# Patient Record
Sex: Female | Born: 1935 | Race: White | Hispanic: No | Marital: Married | Smoking: Never smoker
Health system: Southern US, Community
[De-identification: ages and names within clinical notes are randomized; demographics above are authoritative.]

## PROBLEM LIST (undated history)

## (undated) DIAGNOSIS — E119 Type 2 diabetes mellitus without complications: Secondary | ICD-10-CM

## (undated) DIAGNOSIS — Z9081 Acquired absence of spleen: Secondary | ICD-10-CM

## (undated) DIAGNOSIS — M4800 Spinal stenosis, site unspecified: Secondary | ICD-10-CM

## (undated) DIAGNOSIS — I10 Essential (primary) hypertension: Secondary | ICD-10-CM

## (undated) DIAGNOSIS — F419 Anxiety disorder, unspecified: Secondary | ICD-10-CM

## (undated) DIAGNOSIS — S0093XA Contusion of unspecified part of head, initial encounter: Secondary | ICD-10-CM

## (undated) DIAGNOSIS — E039 Hypothyroidism, unspecified: Secondary | ICD-10-CM

## (undated) DIAGNOSIS — S72001A Fracture of unspecified part of neck of right femur, initial encounter for closed fracture: Secondary | ICD-10-CM

## (undated) DIAGNOSIS — M1990 Unspecified osteoarthritis, unspecified site: Secondary | ICD-10-CM

## (undated) DIAGNOSIS — C801 Malignant (primary) neoplasm, unspecified: Secondary | ICD-10-CM

## (undated) HISTORY — PX: BACK SURGERY: SHX140

## (undated) HISTORY — PX: FOOT SURGERY: SHX648

## (undated) HISTORY — PX: APPENDECTOMY: SHX54

## (undated) HISTORY — PX: ABDOMINAL HYSTERECTOMY: SHX81

## (undated) HISTORY — PX: SPLENECTOMY, TOTAL: SHX788

## (undated) HISTORY — PX: HAND SURGERY: SHX662

---

## 2000-05-24 DIAGNOSIS — Z9081 Acquired absence of spleen: Secondary | ICD-10-CM

## 2000-05-24 HISTORY — DX: Acquired absence of spleen: Z90.81

## 2014-02-06 ENCOUNTER — Inpatient Hospital Stay (HOSPITAL_COMMUNITY)
Admission: EM | Admit: 2014-02-06 | Discharge: 2014-02-12 | DRG: 470 | Disposition: A | Discharge status: Skilled Nursing Facility | Payer: Medicare Other | Attending: Internal Medicine | Admitting: Internal Medicine

## 2014-02-06 ENCOUNTER — Encounter (HOSPITAL_COMMUNITY): Payer: Self-pay | Admitting: Emergency Medicine

## 2014-02-06 ENCOUNTER — Emergency Department (HOSPITAL_COMMUNITY): Payer: Medicare Other

## 2014-02-06 DIAGNOSIS — S0003XA Contusion of scalp, initial encounter: Secondary | ICD-10-CM | POA: Diagnosis present

## 2014-02-06 DIAGNOSIS — E119 Type 2 diabetes mellitus without complications: Secondary | ICD-10-CM | POA: Diagnosis present

## 2014-02-06 DIAGNOSIS — F411 Generalized anxiety disorder: Secondary | ICD-10-CM | POA: Diagnosis present

## 2014-02-06 DIAGNOSIS — S1093XA Contusion of unspecified part of neck, initial encounter: Secondary | ICD-10-CM

## 2014-02-06 DIAGNOSIS — Z7982 Long term (current) use of aspirin: Secondary | ICD-10-CM

## 2014-02-06 DIAGNOSIS — G589 Mononeuropathy, unspecified: Secondary | ICD-10-CM | POA: Diagnosis present

## 2014-02-06 DIAGNOSIS — Z9089 Acquired absence of other organs: Secondary | ICD-10-CM | POA: Diagnosis not present

## 2014-02-06 DIAGNOSIS — D72829 Elevated white blood cell count, unspecified: Secondary | ICD-10-CM | POA: Diagnosis present

## 2014-02-06 DIAGNOSIS — I10 Essential (primary) hypertension: Secondary | ICD-10-CM | POA: Diagnosis present

## 2014-02-06 DIAGNOSIS — Z7401 Bed confinement status: Secondary | ICD-10-CM | POA: Diagnosis not present

## 2014-02-06 DIAGNOSIS — F3289 Other specified depressive episodes: Secondary | ICD-10-CM | POA: Diagnosis present

## 2014-02-06 DIAGNOSIS — M62838 Other muscle spasm: Secondary | ICD-10-CM | POA: Diagnosis present

## 2014-02-06 DIAGNOSIS — S0083XA Contusion of other part of head, initial encounter: Secondary | ICD-10-CM | POA: Diagnosis present

## 2014-02-06 DIAGNOSIS — G8929 Other chronic pain: Secondary | ICD-10-CM | POA: Diagnosis present

## 2014-02-06 DIAGNOSIS — Y9229 Other specified public building as the place of occurrence of the external cause: Secondary | ICD-10-CM | POA: Diagnosis not present

## 2014-02-06 DIAGNOSIS — Z9081 Acquired absence of spleen: Secondary | ICD-10-CM

## 2014-02-06 DIAGNOSIS — D62 Acute posthemorrhagic anemia: Secondary | ICD-10-CM | POA: Diagnosis not present

## 2014-02-06 DIAGNOSIS — S72009A Fracture of unspecified part of neck of unspecified femur, initial encounter for closed fracture: Principal | ICD-10-CM | POA: Diagnosis present

## 2014-02-06 DIAGNOSIS — S0093XA Contusion of unspecified part of head, initial encounter: Secondary | ICD-10-CM | POA: Diagnosis present

## 2014-02-06 DIAGNOSIS — F329 Major depressive disorder, single episode, unspecified: Secondary | ICD-10-CM | POA: Diagnosis present

## 2014-02-06 DIAGNOSIS — R0902 Hypoxemia: Secondary | ICD-10-CM | POA: Diagnosis not present

## 2014-02-06 DIAGNOSIS — Z79899 Other long term (current) drug therapy: Secondary | ICD-10-CM

## 2014-02-06 DIAGNOSIS — W010XXA Fall on same level from slipping, tripping and stumbling without subsequent striking against object, initial encounter: Secondary | ICD-10-CM | POA: Diagnosis present

## 2014-02-06 DIAGNOSIS — M25559 Pain in unspecified hip: Secondary | ICD-10-CM | POA: Diagnosis present

## 2014-02-06 DIAGNOSIS — R946 Abnormal results of thyroid function studies: Secondary | ICD-10-CM

## 2014-02-06 DIAGNOSIS — M129 Arthropathy, unspecified: Secondary | ICD-10-CM | POA: Diagnosis present

## 2014-02-06 DIAGNOSIS — M4807 Spinal stenosis, lumbosacral region: Secondary | ICD-10-CM

## 2014-02-06 DIAGNOSIS — E038 Other specified hypothyroidism: Secondary | ICD-10-CM

## 2014-02-06 DIAGNOSIS — Z880 Allergy status to penicillin: Secondary | ICD-10-CM | POA: Diagnosis not present

## 2014-02-06 DIAGNOSIS — Z9181 History of falling: Secondary | ICD-10-CM

## 2014-02-06 DIAGNOSIS — E1165 Type 2 diabetes mellitus with hyperglycemia: Secondary | ICD-10-CM

## 2014-02-06 DIAGNOSIS — M4800 Spinal stenosis, site unspecified: Secondary | ICD-10-CM | POA: Diagnosis present

## 2014-02-06 DIAGNOSIS — E039 Hypothyroidism, unspecified: Secondary | ICD-10-CM | POA: Diagnosis present

## 2014-02-06 DIAGNOSIS — Z8543 Personal history of malignant neoplasm of ovary: Secondary | ICD-10-CM | POA: Diagnosis not present

## 2014-02-06 DIAGNOSIS — S72001A Fracture of unspecified part of neck of right femur, initial encounter for closed fracture: Secondary | ICD-10-CM | POA: Diagnosis present

## 2014-02-06 HISTORY — DX: Fracture of unspecified part of neck of right femur, initial encounter for closed fracture: S72.001A

## 2014-02-06 HISTORY — DX: Spinal stenosis, site unspecified: M48.00

## 2014-02-06 HISTORY — DX: Unspecified osteoarthritis, unspecified site: M19.90

## 2014-02-06 HISTORY — DX: Contusion of unspecified part of head, initial encounter: S00.93XA

## 2014-02-06 HISTORY — DX: Essential (primary) hypertension: I10

## 2014-02-06 HISTORY — DX: Hypothyroidism, unspecified: E03.9

## 2014-02-06 HISTORY — DX: Anxiety disorder, unspecified: F41.9

## 2014-02-06 HISTORY — DX: Acquired absence of spleen: Z90.81

## 2014-02-06 HISTORY — DX: Malignant (primary) neoplasm, unspecified: C80.1

## 2014-02-06 HISTORY — DX: Type 2 diabetes mellitus without complications: E11.9

## 2014-02-06 LAB — URINALYSIS, ROUTINE W REFLEX MICROSCOPIC
Bilirubin Urine: NEGATIVE
Glucose, UA: NEGATIVE mg/dL
Hgb urine dipstick: NEGATIVE
Leukocytes, UA: NEGATIVE
Nitrite: NEGATIVE
Protein, ur: NEGATIVE mg/dL
SPECIFIC GRAVITY, URINE: 1.015 (ref 1.005–1.030)
UROBILINOGEN UA: 0.2 mg/dL (ref 0.0–1.0)
pH: 5.5 (ref 5.0–8.0)

## 2014-02-06 LAB — COMPREHENSIVE METABOLIC PANEL
ALT: 28 U/L (ref 0–35)
ANION GAP: 17 — AB (ref 5–15)
AST: 35 U/L (ref 0–37)
Albumin: 4.4 g/dL (ref 3.5–5.2)
Alkaline Phosphatase: 56 U/L (ref 39–117)
BUN: 15 mg/dL (ref 6–23)
CALCIUM: 9.9 mg/dL (ref 8.4–10.5)
CO2: 24 mEq/L (ref 19–32)
Chloride: 100 mEq/L (ref 96–112)
Creatinine, Ser: 0.69 mg/dL (ref 0.50–1.10)
GFR calc Af Amer: >90 mL/min (ref >90)
GFR, EST NON AFRICAN AMERICAN: 81 mL/min — AB (ref >90)
Glucose, Bld: 136 mg/dL — ABNORMAL HIGH (ref 70–99)
Potassium: 3.9 mEq/L (ref 3.7–5.3)
SODIUM: 141 meq/L (ref 137–147)
TOTAL PROTEIN: 7.7 g/dL (ref 6.0–8.3)
Total Bilirubin: 0.4 mg/dL (ref 0.3–1.2)

## 2014-02-06 LAB — CBC WITH DIFFERENTIAL/PLATELET
BASOS ABS: 0 10*3/uL (ref 0.0–0.1)
BASOS PCT: 0 % (ref 0–1)
Eosinophils Absolute: 0.1 10*3/uL (ref 0.0–0.7)
Eosinophils Relative: 1 % (ref 0–5)
HCT: 41.2 % (ref 36.0–46.0)
Hemoglobin: 14.1 g/dL (ref 12.0–15.0)
LYMPHS PCT: 30 % (ref 12–46)
Lymphs Abs: 4.2 10*3/uL — ABNORMAL HIGH (ref 0.7–4.0)
MCH: 29.9 pg (ref 26.0–34.0)
MCHC: 34.2 g/dL (ref 30.0–36.0)
MCV: 87.5 fL (ref 78.0–100.0)
MONO ABS: 1.3 10*3/uL — AB (ref 0.1–1.0)
Monocytes Relative: 9 % (ref 3–12)
NEUTROS ABS: 8.5 10*3/uL — AB (ref 1.7–7.7)
NEUTROS PCT: 60 % (ref 43–77)
Platelets: 274 10*3/uL (ref 150–400)
RBC: 4.71 MIL/uL (ref 3.87–5.11)
RDW: 14.2 % (ref 11.5–15.5)
WBC Morphology: INCREASED
WBC: 14.1 10*3/uL — ABNORMAL HIGH (ref 4.0–10.5)

## 2014-02-06 LAB — GLUCOSE, CAPILLARY: Glucose-Capillary: 131 mg/dL — ABNORMAL HIGH (ref 70–99)

## 2014-02-06 MED ORDER — ONDANSETRON HCL 4 MG PO TABS: 4.0000 mg | ORAL_TABLET | Freq: Four times a day (QID) | ORAL | Status: DC | PRN

## 2014-02-06 MED ORDER — ONDANSETRON HCL 4 MG/2ML IJ SOLN
4.0000 mg | Freq: Once | INTRAMUSCULAR | Status: AC
  Administered 2014-02-06: 4 mg via INTRAMUSCULAR
  Filled 2014-02-06: qty 2

## 2014-02-06 MED ORDER — HYDROMORPHONE HCL 1 MG/ML IJ SOLN
0.5000 mg | INTRAMUSCULAR | Status: DC | PRN
  Administered 2014-02-06 – 2014-02-07 (×3): 0.5 mg via INTRAVENOUS
  Filled 2014-02-06 (×3): qty 1

## 2014-02-06 MED ORDER — BACLOFEN 10 MG PO TABS
10.0000 mg | ORAL_TABLET | Freq: Every day | ORAL | Status: DC | PRN
  Administered 2014-02-08: 10 mg via ORAL
  Filled 2014-02-06: qty 1

## 2014-02-06 MED ORDER — LEVOTHYROXINE SODIUM 25 MCG PO TABS
125.0000 ug | ORAL_TABLET | Freq: Every day | ORAL | Status: DC
  Administered 2014-02-07 – 2014-02-12 (×5): 125 ug via ORAL
  Filled 2014-02-06 (×13): qty 1

## 2014-02-06 MED ORDER — SERTRALINE HCL 50 MG PO TABS
100.0000 mg | ORAL_TABLET | Freq: Every day | ORAL | Status: DC
  Administered 2014-02-07: 100 mg via ORAL
  Filled 2014-02-06: qty 1
  Filled 2014-02-06: qty 2
  Filled 2014-02-06: qty 1

## 2014-02-06 MED ORDER — IRBESARTAN 150 MG PO TABS
150.0000 mg | ORAL_TABLET | Freq: Every day | ORAL | Status: DC
  Administered 2014-02-07 – 2014-02-12 (×5): 150 mg via ORAL
  Filled 2014-02-06 (×5): qty 1

## 2014-02-06 MED ORDER — ATORVASTATIN CALCIUM 20 MG PO TABS
20.0000 mg | ORAL_TABLET | Freq: Every day | ORAL | Status: DC
  Administered 2014-02-07 – 2014-02-11 (×5): 20 mg via ORAL
  Filled 2014-02-06 (×5): qty 1

## 2014-02-06 MED ORDER — HYDROMORPHONE HCL 1 MG/ML IJ SOLN
1.0000 mg | Freq: Once | INTRAMUSCULAR | Status: AC
  Administered 2014-02-06: 1 mg via INTRAVENOUS
  Filled 2014-02-06: qty 1

## 2014-02-06 MED ORDER — AMLODIPINE BESYLATE 5 MG PO TABS
5.0000 mg | ORAL_TABLET | Freq: Every day | ORAL | Status: DC
  Administered 2014-02-07 – 2014-02-09 (×2): 5 mg via ORAL
  Filled 2014-02-06 (×2): qty 1

## 2014-02-06 MED ORDER — AMLODIPINE BESYLATE 5 MG PO TABS: 5.0000 mg | ORAL_TABLET | Freq: Every day | ORAL | Status: DC

## 2014-02-06 MED ORDER — PANTOPRAZOLE SODIUM 40 MG PO TBEC: 40.0000 mg | DELAYED_RELEASE_TABLET | Freq: Every day | ORAL | Status: DC

## 2014-02-06 MED ORDER — OXYCODONE HCL 5 MG PO TABS
5.0000 mg | ORAL_TABLET | ORAL | Status: DC | PRN
  Administered 2014-02-06 – 2014-02-07 (×3): 5 mg via ORAL
  Filled 2014-02-06 (×4): qty 1

## 2014-02-06 MED ORDER — METFORMIN HCL ER 500 MG PO TB24
1000.0000 mg | ORAL_TABLET | Freq: Two times a day (BID) | ORAL | Status: DC
  Administered 2014-02-07 – 2014-02-12 (×10): 1000 mg via ORAL
  Filled 2014-02-06 (×17): qty 2

## 2014-02-06 MED ORDER — ONDANSETRON HCL 4 MG/2ML IJ SOLN
4.0000 mg | Freq: Four times a day (QID) | INTRAMUSCULAR | Status: DC | PRN
  Administered 2014-02-07: 4 mg via INTRAVENOUS
  Filled 2014-02-06: qty 2

## 2014-02-06 MED ORDER — SODIUM CHLORIDE 0.9 % IV SOLN
INTRAVENOUS | Status: DC
  Administered 2014-02-06 – 2014-02-07 (×2): via INTRAVENOUS

## 2014-02-06 MED ORDER — HEPARIN SODIUM (PORCINE) 5000 UNIT/ML IJ SOLN
5000.0000 [IU] | Freq: Three times a day (TID) | INTRAMUSCULAR | Status: DC
  Administered 2014-02-06 – 2014-02-07 (×4): 5000 [IU] via SUBCUTANEOUS
  Filled 2014-02-06 (×3): qty 1

## 2014-02-06 MED ORDER — HYDROCODONE-ACETAMINOPHEN 5-325 MG PO TABS: 1.0000 | ORAL_TABLET | Freq: Four times a day (QID) | ORAL | Status: DC | PRN

## 2014-02-06 MED ORDER — PANTOPRAZOLE SODIUM 40 MG PO TBEC
40.0000 mg | DELAYED_RELEASE_TABLET | Freq: Every day | ORAL | Status: DC
  Administered 2014-02-07 – 2014-02-12 (×5): 40 mg via ORAL
  Filled 2014-02-06 (×5): qty 1

## 2014-02-06 MED ORDER — DIAZEPAM 5 MG PO TABS
5.0000 mg | ORAL_TABLET | Freq: Every day | ORAL | Status: DC | PRN
  Administered 2014-02-07 (×2): 5 mg via ORAL
  Filled 2014-02-06 (×2): qty 1

## 2014-02-06 NOTE — ED Notes (Signed)
Attempted to call report, awaiting call from [place], [place].

## 2014-02-06 NOTE — ED Provider Notes (Signed)
CSN: [id number]     Arrival date & time 02/06/14  1352 History   First MD Initiated Contact with Patient 02/06/14 1406     Chief Complaint  Patient presents with  . Fall     (Consider location/radiation/quality/duration/timing/severity/associated sxs/prior Treatment) Patient is a 80 y.o. female presenting with fall. The history is provided by the patient (pt tripped and fell and hit her head and right hip).  Fall This is a new problem. The current episode started 1 to 2 hours ago. The problem occurs constantly. The problem has not changed since onset.Associated symptoms include headaches. Pertinent negatives include no chest pain and no abdominal pain. Exacerbated by: movement of hip. Nothing relieves the symptoms.    Past Medical History  Diagnosis Date  . Arthritis   . Anxiety   . Thyroid disease   . Hypertension   . Diabetes mellitus without complication   . Cancer     ovarian   Past Surgical History  Procedure Laterality Date  . Splenectomy, total    . Abdominal hysterectomy    . Appendectomy    . Hand surgery    . Foot surgery    . Back surgery     No family history on file. History  Substance Use Topics  . Smoking status: Never Smoker   . Smokeless tobacco: Not on file  . Alcohol Use: No   OB History   Grav Para Term Preterm Abortions TAB SAB Ect Mult Living                 Review of Systems  Constitutional: Negative for appetite change and fatigue.  HENT: Negative for congestion, ear discharge and sinus pressure.   Eyes: Negative for discharge.  Respiratory: Negative for cough.   Cardiovascular: Negative for chest pain.  Gastrointestinal: Negative for abdominal pain and diarrhea.  Genitourinary: Negative for frequency and hematuria.  Musculoskeletal: Negative for back pain.       Right hip pain  Skin: Negative for rash.  Neurological: Positive for headaches. Negative for seizures.  Psychiatric/Behavioral: Negative for hallucinations.       Allergies  Ivp dye; Penicillins; and Sulfa antibiotics  Home Medications   Prior to Admission medications   Medication Sig Start Date End Date Taking? Authorizing Provider  amLODipine (NORVASC) 5 MG tablet Take 1 tablet by mouth daily. 01/23/14  Yes Historical Provider, MD  aspirin EC 81 MG tablet Take 81 mg by mouth at bedtime.   Yes Historical Provider, MD  atorvastatin (LIPITOR) 20 MG tablet Take 1 tablet by mouth daily. 11/14/13  Yes Historical Provider, MD  baclofen (LIORESAL) 10 MG tablet Take 1 tablet by mouth daily as needed for muscle spasms.  01/30/14  Yes Historical Provider, MD  BENICAR 20 MG tablet Take 1 tablet by mouth daily. 12/13/13  Yes Historical Provider, MD  diazepam (VALIUM) 5 MG tablet Take 1 tablet by mouth daily as needed for muscle spasms.  01/30/14  Yes Historical Provider, MD  HYDROcodone-acetaminophen (NORCO/VICODIN) 5-325 MG per tablet Take 1 tablet by mouth 4 (four) times daily as needed for moderate pain.  11/28/13  Yes Historical Provider, MD  ibandronate (BONIVA) 150 MG tablet Take 1 tablet by mouth every 30 (thirty) days. First of the month. 12/21/13  Yes Historical Provider, MD  levothyroxine (SYNTHROID, LEVOTHROID) 125 MCG tablet Take 1 tablet by mouth daily. 01/08/14  Yes Historical Provider, MD  metFORMIN (GLUCOPHAGE-XR) 500 MG 24 hr tablet Take 2 tablets by mouth 2 (two) times daily. 01/08/14  Yes Historical Provider, MD  naproxen (NAPROSYN) 500 MG tablet Take 1 tablet by mouth 2 (two) times daily. 01/08/14  Yes Historical Provider, MD  pantoprazole (PROTONIX) 40 MG tablet Take 1 tablet by mouth daily. 01/30/14  Yes Historical Provider, MD  sertraline (ZOLOFT) 100 MG tablet Take 1 tablet by mouth daily. 02/01/14  Yes Historical Provider, MD   BP 140/87  Pulse 95  Temp(Src) 98.2 F (36.8 C) (Oral)  Resp 23  Ht 5\' 2"  (1.575 m)  Wt 158 lb (71.668 kg)  BMI 28.89 kg/m2  SpO2 94% Physical Exam  ED Course  Procedures (including critical care time) Labs  Review Labs Reviewed  CBC WITH DIFFERENTIAL - Abnormal; Notable for the following:    WBC 14.1 (*)    Neutro Abs 8.5 (*)    Lymphs Abs 4.2 (*)    Monocytes Absolute 1.3 (*)    All other components within normal limits  COMPREHENSIVE METABOLIC PANEL - Abnormal; Notable for the following:    Glucose, Bld 136 (*)    GFR calc non Af Amer 81 (*)    Anion gap 17 (*)    All other components within normal limits  URINALYSIS, ROUTINE W REFLEX MICROSCOPIC - Abnormal; Notable for the following:    Ketones, ur TRACE (*)    All other components within normal limits    Imaging Review Dg Chest 1 View  02/06/2014   CLINICAL DATA:  Fall, pain  EXAM: CHEST - 1 VIEW  COMPARISON:  None.  FINDINGS: The aorta is unfolded and ectatic. Moderate enlargement of the cardiac silhouette is noted. Prominence of the central pulmonary arteries with rapid tapering may suggest pulmonary arterial hypertension. No pleural effusion. No acute osseous finding.  IMPRESSION: Cardiomegaly with possible pulmonary arterial hypertension but no focal acute finding allowing for one view technique.   Electronically Signed   By: Conchita Paris M.D.   On: 02/06/2014 15:20   Dg Hip Complete Right  02/06/2014   CLINICAL DATA:  Fall  EXAM: RIGHT HIP - COMPLETE 2+ VIEW  COMPARISON:  None.  FINDINGS: There is a displaced fracture through the right femoral neck. The femoral head appears appropriately located. Lower lumbar spine degenerative changes. SI joints unremarkable. Pelvic phleboliths.  IMPRESSION: Displaced right femoral neck fracture.   Electronically Signed   By: Lovey Newcomer M.D.   On: 02/06/2014 15:21   Ct Head Wo Contrast  02/06/2014   CLINICAL DATA:  Status post fall with hematoma over the right eyebrow  EXAM: CT HEAD WITHOUT CONTRAST  CT CERVICAL SPINE WITHOUT CONTRAST  TECHNIQUE: Multidetector CT imaging of the head and cervical spine was performed following the standard protocol without intravenous contrast. Multiplanar CT image  reconstructions of the cervical spine were also generated.  COMPARISON:  None.  FINDINGS: CT HEAD FINDINGS  The ventricles are normal in size and position. There is no intracranial hemorrhage nor intracranial mass effect. There is no acute ischemic change. The cerebellum and brainstem are normal. The globes and other orbital soft tissues are normal with the exception of preseptal edema on the right. At bone window settings the observed paranasal sinuses and mastoid air cells are clear. There is a large cephalohematoma over the right forehead. There is no acute skull fracture.  CT CERVICAL SPINE FINDINGS  The cervical vertebral bodies are preserved in height. There is mild disc space narrowing at C3-4, C4-5, and C6-7. The prevertebral soft tissue spaces are normal. There is no perched facet nor facet or spinous process  fracture. The odontoid is intact.  IMPRESSION: 1. There is a large right-sided cephalohematoma. There is no acute intracranial hemorrhage nor other acute intracranial abnormality. There is no skull fracture. 2. There is no acute cervical spine fracture nor dislocation. There is degenerative disc change at multiple levels.   Electronically Signed   By: David  Martinique   On: 02/06/2014 15:43   Ct Cervical Spine Wo Contrast  02/06/2014   CLINICAL DATA:  Status post fall with hematoma over the right eyebrow  EXAM: CT HEAD WITHOUT CONTRAST  CT CERVICAL SPINE WITHOUT CONTRAST  TECHNIQUE: Multidetector CT imaging of the head and cervical spine was performed following the standard protocol without intravenous contrast. Multiplanar CT image reconstructions of the cervical spine were also generated.  COMPARISON:  None.  FINDINGS: CT HEAD FINDINGS  The ventricles are normal in size and position. There is no intracranial hemorrhage nor intracranial mass effect. There is no acute ischemic change. The cerebellum and brainstem are normal. The globes and other orbital soft tissues are normal with the exception of  preseptal edema on the right. At bone window settings the observed paranasal sinuses and mastoid air cells are clear. There is a large cephalohematoma over the right forehead. There is no acute skull fracture.  CT CERVICAL SPINE FINDINGS  The cervical vertebral bodies are preserved in height. There is mild disc space narrowing at C3-4, C4-5, and C6-7. The prevertebral soft tissue spaces are normal. There is no perched facet nor facet or spinous process fracture. The odontoid is intact.  IMPRESSION: 1. There is a large right-sided cephalohematoma. There is no acute intracranial hemorrhage nor other acute intracranial abnormality. There is no skull fracture. 2. There is no acute cervical spine fracture nor dislocation. There is degenerative disc change at multiple levels.   Electronically Signed   By: David  Martinique   On: 02/06/2014 15:43     EKG Interpretation   Date/Time:  Wednesday February 06 2014 16:47:22 EDT Ventricular Rate:  90 PR Interval:  144 QRS Duration: 91 QT Interval:  386 QTC Calculation: 472 R Axis:   -14 Text Interpretation:  Sinus rhythm Nonspecific T abnormalities, diffuse  leads Confirmed by Ashante Snelling  MD, B[place] Ilg [phone]) on 02/06/2014 6:09:13 PM      MDM   Final diagnoses:  Hip fracture requiring operative repair, right, closed, initial encounter    Hip fracture,   Admit to medicine and dr. Aline Brochure to see pt in the am    Maudry Diego, MD 02/06/14 [phone]

## 2014-02-06 NOTE — ED Notes (Signed)
Pt's legs became weak and she fell while shopping at [facility]. Pt states she has chronic back problems and intermittent weakness/falls. Pt has hematoma to right eyebrow. Pt also c/o soreness in right rib cage and pain to right hip.

## 2014-02-06 NOTE — H&P (Signed)
[facility] History and Physical  Elizabeth [name] WEX:[id number] DOB: [birth year] DOA: 02/06/2014  Referring physician: ER PCP: No primary provider on file.   Chief Complaint: Right hip pain.  HPI: Elizabeth [name] is a 80 y.o. female  This is a 71 your lady who had an accidental fall today approximately 7 hours ago. She was at a clothing store and her legs became somewhat numb.Marland Kitchen She went to sit down somewhere and was holding onto garment stand  with rolls on it. The garments stand rolled  and she fell. She did hit her head but did not lose consciousness. Her right hip again painful. Evaluation in the emergency room shows that she has a right hip fracture and she is now being admitted for further management. She has hypertension and diabetes. There is no history of ischemic heart disease or cerebrovascular disease. She has no lung disease.   Review of Systems:  Constitutional:  No weight loss, night sweats, Fevers, chills, fatigue.  HEENT:  No headaches, Difficulty swallowing,Tooth/dental problems,Sore throat,  No sneezing, itching, ear ache, nasal congestion, post nasal drip,  Cardio-vascular:  No chest pain, Orthopnea, PND, swelling in lower extremities, anasarca, dizziness, palpitations  GI:  No heartburn, indigestion, abdominal pain, nausea, vomiting, diarrhea, change in bowel habits, loss of appetite  Resp:  No shortness of breath with exertion or at rest. No excess mucus, no productive cough, No non-productive cough, No coughing up of blood.No change in color of mucus.No wheezing.No chest wall deformity  Skin:  no rash or lesions.  GU:  no dysuria, change in color of urine, no urgency or frequency. No flank pain.   Psych:  No change in mood or affect. No depression or anxiety. No memory loss.   Past Medical History  Diagnosis Date  . Arthritis   . Anxiety   . Thyroid disease   . Hypertension   . Diabetes mellitus without complication   . Cancer     ovarian   Past  Surgical History  Procedure Laterality Date  . Splenectomy, total    . Abdominal hysterectomy    . Appendectomy    . Hand surgery    . Foot surgery    . Back surgery     Social History:  reports that she has never smoked. She does not have any smokeless tobacco history on file. She reports that she does not drink alcohol or use illicit drugs.  Allergies  Allergen Reactions  . Ivp Dye [Iodinated Diagnostic Agents] Hives  . Penicillins Hives  . Sulfa Antibiotics Nausea And Vomiting    No family history on file.   Prior to Admission medications   Medication Sig Start Date End Date Taking? Authorizing Provider  amLODipine (NORVASC) 5 MG tablet Take 1 tablet by mouth daily. 01/23/14  Yes Historical Provider, MD  aspirin EC 81 MG tablet Take 81 mg by mouth at bedtime.   Yes Historical Provider, MD  atorvastatin (LIPITOR) 20 MG tablet Take 1 tablet by mouth daily. 11/14/13  Yes Historical Provider, MD  baclofen (LIORESAL) 10 MG tablet Take 1 tablet by mouth daily as needed for muscle spasms.  01/30/14  Yes Historical Provider, MD  BENICAR 20 MG tablet Take 1 tablet by mouth daily. 12/13/13  Yes Historical Provider, MD  diazepam (VALIUM) 5 MG tablet Take 1 tablet by mouth daily as needed for muscle spasms.  01/30/14  Yes Historical Provider, MD  HYDROcodone-acetaminophen (NORCO/VICODIN) 5-325 MG per tablet Take 1 tablet by mouth 4 (four) times daily as  needed for moderate pain.  11/28/13  Yes Historical Provider, MD  ibandronate (BONIVA) 150 MG tablet Take 1 tablet by mouth every 30 (thirty) days. First of the month. 12/21/13  Yes Historical Provider, MD  levothyroxine (SYNTHROID, LEVOTHROID) 125 MCG tablet Take 1 tablet by mouth daily. 01/08/14  Yes Historical Provider, MD  metFORMIN (GLUCOPHAGE-XR) 500 MG 24 hr tablet Take 2 tablets by mouth 2 (two) times daily. 01/08/14  Yes Historical Provider, MD  naproxen (NAPROSYN) 500 MG tablet Take 1 tablet by mouth 2 (two) times daily. 01/08/14  Yes Historical  Provider, MD  pantoprazole (PROTONIX) 40 MG tablet Take 1 tablet by mouth daily. 01/30/14  Yes Historical Provider, MD  sertraline (ZOLOFT) 100 MG tablet Take 1 tablet by mouth daily. 02/01/14  Yes Historical Provider, MD   Physical Exam: Filed Vitals:   02/06/14 1645 02/06/14 1700 02/06/14 1730 02/06/14 1830  BP: 160/73 141/75 140/87 143/75  Pulse: 83 95  87  Temp: 98.2 F (36.8 C)     TempSrc: Oral     Resp: 16 11 23 13   Height:      Weight:      SpO2: 90% 94%  91%    Wt Readings from Last 3 Encounters:  02/06/14 71.668 kg (158 lb)    General:  Appears calm and comfortable Eyes: PERRL, normal lids, irises & conjunctiva ENT: grossly normal hearing, lips & tongue Neck: no LAD, masses or thyromegaly Cardiovascular: RRR, no m/r/g. No LE edema. Telemetry: SR, no arrhythmias  Respiratory: CTA bilaterally, no w/r/r. Normal respiratory effort. Abdomen: soft, ntnd Skin: no rash or induration seen on limited exam Musculoskeletal: Tender in the right hip. Psychiatric: grossly normal mood and affect, speech fluent and appropriate Neurologic: grossly non-focal.          Labs on Admission:  Basic Metabolic Panel:  Recent Labs Lab 02/06/14 1411  NA 141  K 3.9  CL 100  CO2 24  GLUCOSE 136*  BUN 15  CREATININE 0.69  CALCIUM 9.9   Liver Function Tests:  Recent Labs Lab 02/06/14 1411  AST 35  ALT 28  ALKPHOS 56  BILITOT 0.4  PROT 7.7  ALBUMIN 4.4   No results found for this basename: LIPASE, AMYLASE,  in the last 168 hours No results found for this basename: AMMONIA,  in the last 168 hours CBC:  Recent Labs Lab 02/06/14 1411  WBC 14.1*  NEUTROABS 8.5*  HGB 14.1  HCT 41.2  MCV 87.5  PLT 274   Cardiac Enzymes: No results found for this basename: CKTOTAL, CKMB, CKMBINDEX, TROPONINI,  in the last 168 hours  BNP (last 3 results) No results found for this basename: PROBNP,  in the last 8760 hours CBG: No results found for this basename: GLUCAP,  in the last  168 hours  Radiological Exams on Admission: Dg Chest 1 View  02/06/2014   CLINICAL DATA:  Fall, pain  EXAM: CHEST - 1 VIEW  COMPARISON:  None.  FINDINGS: The aorta is unfolded and ectatic. Moderate enlargement of the cardiac silhouette is noted. Prominence of the central pulmonary arteries with rapid tapering may suggest pulmonary arterial hypertension. No pleural effusion. No acute osseous finding.  IMPRESSION: Cardiomegaly with possible pulmonary arterial hypertension but no focal acute finding allowing for one view technique.   Electronically Signed   By: Conchita Paris M.D.   On: 02/06/2014 15:20   Dg Hip Complete Right  02/06/2014   CLINICAL DATA:  Fall  EXAM: RIGHT HIP - COMPLETE 2+  VIEW  COMPARISON:  None.  FINDINGS: There is a displaced fracture through the right femoral neck. The femoral head appears appropriately located. Lower lumbar spine degenerative changes. SI joints unremarkable. Pelvic phleboliths.  IMPRESSION: Displaced right femoral neck fracture.   Electronically Signed   By: Lovey Newcomer M.D.   On: 02/06/2014 15:21   Ct Head Wo Contrast  02/06/2014   CLINICAL DATA:  Status post fall with hematoma over the right eyebrow  EXAM: CT HEAD WITHOUT CONTRAST  CT CERVICAL SPINE WITHOUT CONTRAST  TECHNIQUE: Multidetector CT imaging of the head and cervical spine was performed following the standard protocol without intravenous contrast. Multiplanar CT image reconstructions of the cervical spine were also generated.  COMPARISON:  None.  FINDINGS: CT HEAD FINDINGS  The ventricles are normal in size and position. There is no intracranial hemorrhage nor intracranial mass effect. There is no acute ischemic change. The cerebellum and brainstem are normal. The globes and other orbital soft tissues are normal with the exception of preseptal edema on the right. At bone window settings the observed paranasal sinuses and mastoid air cells are clear. There is a large cephalohematoma over the right forehead.  There is no acute skull fracture.  CT CERVICAL SPINE FINDINGS  The cervical vertebral bodies are preserved in height. There is mild disc space narrowing at C3-4, C4-5, and C6-7. The prevertebral soft tissue spaces are normal. There is no perched facet nor facet or spinous process fracture. The odontoid is intact.  IMPRESSION: 1. There is a large right-sided cephalohematoma. There is no acute intracranial hemorrhage nor other acute intracranial abnormality. There is no skull fracture. 2. There is no acute cervical spine fracture nor dislocation. There is degenerative disc change at multiple levels.   Electronically Signed   By: David  Martinique   On: 02/06/2014 15:43   Ct Cervical Spine Wo Contrast  02/06/2014   CLINICAL DATA:  Status post fall with hematoma over the right eyebrow  EXAM: CT HEAD WITHOUT CONTRAST  CT CERVICAL SPINE WITHOUT CONTRAST  TECHNIQUE: Multidetector CT imaging of the head and cervical spine was performed following the standard protocol without intravenous contrast. Multiplanar CT image reconstructions of the cervical spine were also generated.  COMPARISON:  None.  FINDINGS: CT HEAD FINDINGS  The ventricles are normal in size and position. There is no intracranial hemorrhage nor intracranial mass effect. There is no acute ischemic change. The cerebellum and brainstem are normal. The globes and other orbital soft tissues are normal with the exception of preseptal edema on the right. At bone window settings the observed paranasal sinuses and mastoid air cells are clear. There is a large cephalohematoma over the right forehead. There is no acute skull fracture.  CT CERVICAL SPINE FINDINGS  The cervical vertebral bodies are preserved in height. There is mild disc space narrowing at C3-4, C4-5, and C6-7. The prevertebral soft tissue spaces are normal. There is no perched facet nor facet or spinous process fracture. The odontoid is intact.  IMPRESSION: 1. There is a large right-sided cephalohematoma.  There is no acute intracranial hemorrhage nor other acute intracranial abnormality. There is no skull fracture. 2. There is no acute cervical spine fracture nor dislocation. There is degenerative disc change at multiple levels.   Electronically Signed   By: David  Martinique   On: 02/06/2014 15:43     Assessment/Plan   1. Right hip fracture. 2. Hypertension. 3. Diabetes mellitus. 4. Hypothyroidism.  Plan: 1. Admit to medical floor. 2. Gentle IV fluids.  3. Moderate control diabetes. 4. Orthopedic consultation with a view to surgery.  Further recommendations will depend on patient's hospital progress.  Code Status: Full code.   DVT Prophylaxis: Heparin.  Family Communication: I discussed the plan with patient at the bedside.   Disposition Plan: Will likely require rehabilitation facility postoperatively.   Time spent: 60 minutes.  Doree Albee [facility] Pager [phone].

## 2014-02-07 ENCOUNTER — Encounter (HOSPITAL_COMMUNITY): Payer: Self-pay | Admitting: Orthopedic Surgery

## 2014-02-07 DIAGNOSIS — M4800 Spinal stenosis, site unspecified: Secondary | ICD-10-CM | POA: Diagnosis present

## 2014-02-07 DIAGNOSIS — S0093XA Contusion of unspecified part of head, initial encounter: Secondary | ICD-10-CM | POA: Diagnosis present

## 2014-02-07 DIAGNOSIS — E039 Hypothyroidism, unspecified: Secondary | ICD-10-CM

## 2014-02-07 LAB — COMPREHENSIVE METABOLIC PANEL
ALT: 24 U/L (ref 0–35)
ANION GAP: 12 (ref 5–15)
AST: 26 U/L (ref 0–37)
Albumin: 3.8 g/dL (ref 3.5–5.2)
Alkaline Phosphatase: 52 U/L (ref 39–117)
BUN: 10 mg/dL (ref 6–23)
CALCIUM: 9.2 mg/dL (ref 8.4–10.5)
CO2: 27 mEq/L (ref 19–32)
Chloride: 102 mEq/L (ref 96–112)
Creatinine, Ser: 0.64 mg/dL (ref 0.50–1.10)
GFR calc Af Amer: >90 mL/min (ref >90)
GFR, EST NON AFRICAN AMERICAN: 83 mL/min — AB (ref >90)
Glucose, Bld: 160 mg/dL — ABNORMAL HIGH (ref 70–99)
Potassium: 3.9 mEq/L (ref 3.7–5.3)
Sodium: 141 mEq/L (ref 137–147)
TOTAL PROTEIN: 7 g/dL (ref 6.0–8.3)
Total Bilirubin: 0.5 mg/dL (ref 0.3–1.2)

## 2014-02-07 LAB — GLUCOSE, CAPILLARY
GLUCOSE-CAPILLARY: 139 mg/dL — AB (ref 70–99)
GLUCOSE-CAPILLARY: 177 mg/dL — AB (ref 70–99)
Glucose-Capillary: 119 mg/dL — ABNORMAL HIGH (ref 70–99)
Glucose-Capillary: 181 mg/dL — ABNORMAL HIGH (ref 70–99)

## 2014-02-07 LAB — CBC
HCT: 37.9 % (ref 36.0–46.0)
HEMOGLOBIN: 12.9 g/dL (ref 12.0–15.0)
MCH: 30.1 pg (ref 26.0–34.0)
MCHC: 34 g/dL (ref 30.0–36.0)
MCV: 88.3 fL (ref 78.0–100.0)
PLATELETS: 276 10*3/uL (ref 150–400)
RBC: 4.29 MIL/uL (ref 3.87–5.11)
RDW: 14.4 % (ref 11.5–15.5)
WBC: 11.1 10*3/uL — ABNORMAL HIGH (ref 4.0–10.5)

## 2014-02-07 LAB — ABO/RH: ABO/RH(D): A POS

## 2014-02-07 LAB — MRSA PCR SCREENING: MRSA BY PCR: NEGATIVE

## 2014-02-07 LAB — PREPARE RBC (CROSSMATCH)

## 2014-02-07 MED ORDER — HYDROMORPHONE HCL 1 MG/ML IJ SOLN
0.5000 mg | INTRAMUSCULAR | Status: DC | PRN
  Administered 2014-02-07 – 2014-02-08 (×10): 0.5 mg via INTRAVENOUS
  Filled 2014-02-07 (×10): qty 1

## 2014-02-07 MED ORDER — CLINDAMYCIN PHOSPHATE 900 MG/50ML IV SOLN
900.0000 mg | INTRAVENOUS | Status: DC
  Filled 2014-02-07: qty 50

## 2014-02-07 MED ORDER — CHLORHEXIDINE GLUCONATE 4 % EX LIQD: 60.0000 mL | Freq: Once | CUTANEOUS | Status: DC

## 2014-02-07 MED ORDER — CHLORHEXIDINE GLUCONATE 4 % EX LIQD
60.0000 mL | Freq: Once | CUTANEOUS | Status: AC
  Administered 2014-02-08: 4 via TOPICAL
  Filled 2014-02-07: qty 15

## 2014-02-07 MED ORDER — CLINDAMYCIN PHOSPHATE 900 MG/50ML IV SOLN
900.0000 mg | INTRAVENOUS | Status: AC
  Administered 2014-02-08: 900 mg via INTRAVENOUS
  Filled 2014-02-07 (×2): qty 50

## 2014-02-07 MED ORDER — DIAZEPAM 5 MG PO TABS
2.5000 mg | ORAL_TABLET | Freq: Once | ORAL | Status: AC
  Administered 2014-02-07: 2.5 mg via ORAL
  Filled 2014-02-07: qty 1

## 2014-02-07 MED ORDER — OXYCODONE-ACETAMINOPHEN 5-325 MG PO TABS
1.0000 | ORAL_TABLET | ORAL | Status: DC | PRN
  Administered 2014-02-07 (×2): 1 via ORAL
  Filled 2014-02-07 (×2): qty 1

## 2014-02-07 NOTE — Progress Notes (Signed)
UR chart review completed.  

## 2014-02-07 NOTE — Clinical Social Work Placement (Signed)
Clinical Social Work Department CLINICAL SOCIAL WORK PLACEMENT NOTE 02/07/2014  Patient:  [facility]  Account Number:  [id number] Admit date:  02/06/2014  Clinical Social Worker:  Benay Pike, LCSW  Date/time:  02/07/2014 02:51 PM  Clinical Social Work is seeking post-discharge placement for this patient at the following level of care:   SKILLED NURSING   (*CSW will update this form in Epic as items are completed)   02/07/2014  Patient/family provided with [place] Department of Clinical Social Work's list of facilities offering this level of care within the geographic area requested by the patient (or if unable, by the patient's family).  02/07/2014  Patient/family informed of their freedom to choose among providers that offer the needed level of care, that participate in Medicare, Medicaid or managed care program needed by the patient, have an available bed and are willing to accept the patient.  02/07/2014  Patient/family informed of [facility]' ownership interest in [facility], as well as of the fact that they are under no obligation to receive care at this facility.  PASARR submitted to EDS on  PASARR number received on   FL2 transmitted to all facilities in geographic area requested by pt/family on  02/07/2014 FL2 transmitted to all facilities within larger geographic area on   Patient informed that his/her managed care company has contracts with or will negotiate with  certain facilities, including the following:     Patient/family informed of bed offers received:   Patient chooses bed at  Physician recommends and patient chooses bed at    Patient to be transferred to  on   Patient to be transferred to facility by  Patient and family notified of transfer on  Name of family member notified:    The following physician request were entered in Epic:   Additional Comments: No pasarr needed per [place] facilities.  Benay Pike, [place]

## 2014-02-07 NOTE — Care Management Note (Addendum)
    Page 1 of 1   02/12/2014     2:06:34 PM CARE MANAGEMENT NOTE 02/12/2014  Patient:  [facility]   Account Number:  [id number]  Date Initiated:  02/07/2014  Documentation initiated by:  Theophilus Kinds  Subjective/Objective Assessment:   Pt admitted from home with hip fracture. Pt lives at home with her husband. Pt would like to return home if possible after surgery.     Action/Plan:   Will continue to follow for discharge planning needs. CSW is aware of pt and possible need for SNF if recommended by PT after surgery.   Anticipated DC Date:  02/12/2014   Anticipated DC Plan:  SKILLED NURSING FACILITY  In-house referral  Clinical Social Worker      DC Planning Services  CM consult      Choice offered to / List presented to:             Status of service:  Completed, signed off Medicare Important Message given?  YES (If response is "NO", the following Medicare IM given date fields will be blank) Date Medicare IM given:  02/11/2014 Medicare IM given by:  Vladimir Creeks Date Additional Medicare IM given:   Additional Medicare IM given by:    Discharge Disposition:  [place]  Per UR Regulation:  Reviewed for med. necessity/level of care/duration of stay  If discussed at [place] of Stay Meetings, dates discussed:    Comments:  02/12/14 [place] RN/CM Pt chose to go to SNF for Rehab prior to returning home. D/C to [facility], in [place] 02/07/14 Badger Lee, RN BSN CM

## 2014-02-07 NOTE — Clinical Social Work Psychosocial (Signed)
Clinical Social Work Department BRIEF PSYCHOSOCIAL ASSESSMENT 02/07/2014  Patient:  [name]     Account Number:  [id number]     Admit date:  02/06/2014  Clinical Social Worker:  Wyatt Haste  Date/Time:  02/07/2014 02:52 PM  Referred by:  CSW  Date Referred:  02/07/2014 Referred for  SNF Placement   Other Referral:   Interview type:  Patient Other interview type:   granddaughter: Levada Dy    PSYCHOSOCIAL DATA Living Status:  FAMILY Admitted from facility:   Level of care:   Primary support name:  Chrissie Noa Primary support relationship to patient:  SPOUSE Degree of support available:   supportive family per pt    CURRENT CONCERNS Current Concerns  Post-Acute Placement   Other Concerns:    SOCIAL WORK ASSESSMENT / PLAN CSW met with pt and pt's granddaughter, Levada Dy at bedside. Pt alert and oriented and reports she lives at home with her husband. She was in [place] yesterday when she said her legs went numb and she grabbed onto a rack for support. The rack slid and pt fell, fracturing her hip. She is scheduled for surgery tomorrow. Pt has a very supportive family and children and granddaughters are very involved. At baseline, she ambulates independently but will use a rollator for longer distances. Pt still drives. She is anxious for surgery to be over and behind her. CSW discussed d/c planning. Pt and granddaughter are hopeful that pt would be able to return home. She is aware that it is likely that SNF would be recommended. SNF list provided. Pt lives in [place]. Granddaughter thought it was possible pt would rather be placed in [place] as some family is here. CSW followed up with pt this afternoon and she states if she needs to go to rehab she would prefer in [place] as her husband can only drive in town. Pt requests [place] if possible. Referral sent to [facility]. Admissions there will review and report pt does not need pasarr.   Assessment/plan status:  Psychosocial  Support/Ongoing Assessment of Needs Other assessment/ plan:   Information/referral to community resources:   SNF list    PATIENT'S/FAMILY'S RESPONSE TO PLAN OF CARE: Pt still remains hopeful that she could return home with home health and family support, but if necessary requests SNF in [place]. CSW will follow up.       Benay Pike, [place]

## 2014-02-07 NOTE — Progress Notes (Signed)
Patient complaining of dilaudid not lasting full 2 hours. Dr. Caryn Section notified. May give oxycodone between every 2 hour dilaudid. MRSA PCR sent to lab per protocol of surgery patient. Consent signed

## 2014-02-07 NOTE — Progress Notes (Signed)
[facility] PROGRESS NOTE  [name] TOI:[id number] DOB: July 10, 1933 DOA: 02/06/2014 PCP: No primary provider on file.  Assessment/Plan  Code Status: Full code Family Communication: Discussed with her granddaughter, Ms. Maxwell Caul Disposition Plan: Likely discharge to skilled nursing facility when medically appropriate.   Consultants:  Orthopedic, Dr. Aline Brochure  Procedures:  Right hip surgery pending  Antibiotics:  Preop clindamycin pending  HPI/Subjective: The patient says that her right hip pain is under control. She denies headache or difficulty seeing out of her right eye. She denies chest pain or chest congestion.  Objective: Filed Vitals:   02/07/14 0602  BP: 127/60  Pulse: 72  Temp: 98 F (36.7 C)  Resp: 20    Intake/Output Summary (Last 24 hours) at 02/07/14 1030 Last data filed at 02/07/14 8338  Gross per 24 hour  Intake 491.67 ml  Output    900 ml  Net -408.33 ml   Filed Weights   02/06/14 1357 02/06/14 1924  Weight: 71.668 kg (158 lb) 73.2 kg (161 lb 6 oz)    Exam:   General:  80 year old Caucasian woman sitting up in bed, in no acute distress.  Face/head: Large scalp hematoma on the right for head with periorbital ecchymosis, mildly tender. Pupils equal, round, and reactive to light. Extraocular movements are intact.  Cardiovascular: S1, S2, with a soft systolic murmur.  Respiratory: Decreased breath sounds in the bases, clear anteriorly.  Abdomen: Positive bowel sounds, soft, nontender, nondistended.  Musculoskeletal: Right hip not moved because of pain and fracture. No pedal edema or pretibial edema.   Data Reviewed: Basic Metabolic Panel:  Recent Labs Lab 02/06/14 1411 02/07/14 0513  NA 141 141  K 3.9 3.9  CL 100 102  CO2 24 27  GLUCOSE 136* 160*  BUN 15 10  CREATININE 0.69 0.64  CALCIUM 9.9 9.2   Liver Function Tests:  Recent Labs Lab 02/06/14 1411 02/07/14 0513  AST 35 26  ALT 28 24  ALKPHOS 56 52  BILITOT 0.4  0.5  PROT 7.7 7.0  ALBUMIN 4.4 3.8   No results found for this basename: LIPASE, AMYLASE,  in the last 168 hours No results found for this basename: AMMONIA,  in the last 168 hours CBC:  Recent Labs Lab 02/06/14 1411 02/07/14 0513  WBC 14.1* 11.1*  NEUTROABS 8.5*  --   HGB 14.1 12.9  HCT 41.2 37.9  MCV 87.5 88.3  PLT 274 276   Cardiac Enzymes: No results found for this basename: CKTOTAL, CKMB, CKMBINDEX, TROPONINI,  in the last 168 hours BNP (last 3 results) No results found for this basename: PROBNP,  in the last 8760 hours CBG:  Recent Labs Lab 02/06/14 2022 02/07/14 0814  GLUCAP 131* 119*    No results found for this or any previous visit (from the past 240 hour(s)).   Studies: Dg Chest 1 View  02/06/2014   CLINICAL DATA:  Fall, pain  EXAM: CHEST - 1 VIEW  COMPARISON:  None.  FINDINGS: The aorta is unfolded and ectatic. Moderate enlargement of the cardiac silhouette is noted. Prominence of the central pulmonary arteries with rapid tapering may suggest pulmonary arterial hypertension. No pleural effusion. No acute osseous finding.  IMPRESSION: Cardiomegaly with possible pulmonary arterial hypertension but no focal acute finding allowing for one view technique.   Electronically Signed   By: Conchita Paris M.D.   On: 02/06/2014 15:20   Dg Hip Complete Right  02/06/2014   CLINICAL DATA:  Fall  EXAM: RIGHT HIP - COMPLETE 2+  VIEW  COMPARISON:  None.  FINDINGS: There is a displaced fracture through the right femoral neck. The femoral head appears appropriately located. Lower lumbar spine degenerative changes. SI joints unremarkable. Pelvic phleboliths.  IMPRESSION: Displaced right femoral neck fracture.   Electronically Signed   By: Lovey Newcomer M.D.   On: 02/06/2014 15:21   Ct Head Wo Contrast  02/06/2014   CLINICAL DATA:  Status post fall with hematoma over the right eyebrow  EXAM: CT HEAD WITHOUT CONTRAST  CT CERVICAL SPINE WITHOUT CONTRAST  TECHNIQUE: Multidetector CT  imaging of the head and cervical spine was performed following the standard protocol without intravenous contrast. Multiplanar CT image reconstructions of the cervical spine were also generated.  COMPARISON:  None.  FINDINGS: CT HEAD FINDINGS  The ventricles are normal in size and position. There is no intracranial hemorrhage nor intracranial mass effect. There is no acute ischemic change. The cerebellum and brainstem are normal. The globes and other orbital soft tissues are normal with the exception of preseptal edema on the right. At bone window settings the observed paranasal sinuses and mastoid air cells are clear. There is a large cephalohematoma over the right forehead. There is no acute skull fracture.  CT CERVICAL SPINE FINDINGS  The cervical vertebral bodies are preserved in height. There is mild disc space narrowing at C3-4, C4-5, and C6-7. The prevertebral soft tissue spaces are normal. There is no perched facet nor facet or spinous process fracture. The odontoid is intact.  IMPRESSION: 1. There is a large right-sided cephalohematoma. There is no acute intracranial hemorrhage nor other acute intracranial abnormality. There is no skull fracture. 2. There is no acute cervical spine fracture nor dislocation. There is degenerative disc change at multiple levels.   Electronically Signed   By: David  Martinique   On: 02/06/2014 15:43   Ct Cervical Spine Wo Contrast  02/06/2014   CLINICAL DATA:  Status post fall with hematoma over the right eyebrow  EXAM: CT HEAD WITHOUT CONTRAST  CT CERVICAL SPINE WITHOUT CONTRAST  TECHNIQUE: Multidetector CT imaging of the head and cervical spine was performed following the standard protocol without intravenous contrast. Multiplanar CT image reconstructions of the cervical spine were also generated.  COMPARISON:  None.  FINDINGS: CT HEAD FINDINGS  The ventricles are normal in size and position. There is no intracranial hemorrhage nor intracranial mass effect. There is no acute  ischemic change. The cerebellum and brainstem are normal. The globes and other orbital soft tissues are normal with the exception of preseptal edema on the right. At bone window settings the observed paranasal sinuses and mastoid air cells are clear. There is a large cephalohematoma over the right forehead. There is no acute skull fracture.  CT CERVICAL SPINE FINDINGS  The cervical vertebral bodies are preserved in height. There is mild disc space narrowing at C3-4, C4-5, and C6-7. The prevertebral soft tissue spaces are normal. There is no perched facet nor facet or spinous process fracture. The odontoid is intact.  IMPRESSION: 1. There is a large right-sided cephalohematoma. There is no acute intracranial hemorrhage nor other acute intracranial abnormality. There is no skull fracture. 2. There is no acute cervical spine fracture nor dislocation. There is degenerative disc change at multiple levels.   Electronically Signed   By: David  Martinique   On: 02/06/2014 15:43    Scheduled Meds: . amLODipine  5 mg Oral Daily  . atorvastatin  20 mg Oral q1800  . [START ON 02/08/2014] chlorhexidine  60 mL  Topical Once  . [START ON 02/08/2014] clindamycin (CLEOCIN) IV  900 mg Intravenous On Call to OR  . heparin  5,000 Units Subcutaneous 3 times per day  . irbesartan  150 mg Oral Daily  . levothyroxine  125 mcg Oral QAC breakfast  . metFORMIN  1,000 mg Oral BID WC  . pantoprazole  40 mg Oral Daily  . sertraline  100 mg Oral Daily   Continuous Infusions: . sodium chloride 50 mL/hr at 02/07/14 0919   Assessment and plan: Principal Problem:   Fracture of femoral neck, right Active Problems:   Traumatic hematoma of head   HTN (hypertension)   Diabetes mellitus   Hypothyroidism   Spinal stenosis   1. Acute right femoral neck fracture/hip fracture secondary to fall. We'll continue supportive treatment and pain management. Dr. Aline Brochure has been consulted and the plan is for bipolar hip replacement on  9/18.  Large right-sided cephalohematoma, secondary to fall. CT of her head revealed no intracranial hemorrhage or other acute intracranial abnormality. Neurologically, she is intact. We will continue to monitor her and provide supportive treatment.  History of spinal stenosis. She was scheduled to see neurosurgeon, Dr. Carloyn Manner.  Hypertension. Currently stable on ARB and amlodipine.  Diabetes mellitus, type II. We'll continue metformin. We'll monitor her CBGs before each meal.  Hypothyroidism. We'll continue Synthroid. We'll order TSH.  Leukocytosis. Likely reactive. No clinical signs of infection as her urinalysis is negative and there is no pneumonia on the chest x-ray.      Time spent: 35 minutes.    [facility] Pager [phone]. If 7PM-7AM, please contact night-coverage at [email or web address], password [facility] 02/07/2014, 10:30 AM  LOS: 1 day

## 2014-02-07 NOTE — Consult Note (Signed)
[facility] Consultation Oncology  Name: Elizabeth [name]      MRN: [id number]    Location: A301/A301-01  Date: 02/07/2014 Time:10:16 AM   REFERRING PHYSICIAN:  Carole Civil, MD  REASON FOR CONSULT:  Splenectomy, ? vax and post op anticoagulation   DIAGNOSIS:  History of ITP in 2002 treated with splenectomy at [facility] under the hematologic direction of Dr. Jerrye Noble.  Also, history of ovarian cancer finishing treatment in 2003- 2004, presently under the care of Dr. Rayford Halsted.   HISTORY OF PRESENT ILLNESS:   Elizabeth [name] is a pleasant 80 yo white woman with a past medical history significant for ITP treated with splenectomy in 2002, history of an unstaged poorly differentiated ovarian cancer treated with surgery and 6 cycles of systemic chemotherapy finishing in 2003-2004, HTN, DM, and hypothyroidism who presented to the [facility] ED on 02/06/2014 after falling at a local [employer] store.  In the ED, she underwent an xray of right hip demonstrating a displaced right femoral neck fracture.  I personally reviewed and went over radiographic studies with the patient.  The results are noted within this dictation.  Labs were also performed. I personally reviewed and went over laboratory results with the patient.  The results are noted within this dictation.  It is noted that she has a leukocytosis which is reactive to the stress of her fracture with a normal Hgb and platelet count.  Reviewing her hem/onc history reveals the following information: In 2002 she underwent splenectomy under the hematologic direction of Dr. Jerrye Noble at [facility] for ITP.  She was appropriately immunized prior to splenectomy, the patient reports.  On further review of her [facility] records, she was also diagnosed with an unstaged, poorly differentiated ovarian cancer treated with surgery and 6 cycles of systemic chemotherapy finishing all treatment in 2003-2004.  Specifics of treatment are vague to me and the patient is  a poor historian.    Hematology was consulted for vaccination and pot-op anticoagulation recommendations in preparation for orthopedic surgical intervention of displaced fracture.  She denies any fevers, chills, night sweats, unintentional weight loss, decreased appetite, abdominal pain, chest pain, SOB, dyspnea, blood in stool, black tarry stool, hematuria, pain with urination, and hemoptysis.  PAST MEDICAL HISTORY:   Past Medical History  Diagnosis Date  . Arthritis   . Anxiety   . Hypothyroidism   . Hypertension   . Diabetes mellitus without complication   . Cancer     ovarian  . Fracture of femoral neck, right 02/06/2014    ALLERGIES: Allergies  Allergen Reactions  . Ivp Dye [Iodinated Diagnostic Agents] Hives  . Penicillins Hives  . Sulfa Antibiotics Nausea And Vomiting      MEDICATIONS: I have reviewed the patient's current medications.     PAST SURGICAL HISTORY Past Surgical History  Procedure Laterality Date  . Splenectomy, total    . Abdominal hysterectomy    . Appendectomy    . Hand surgery    . Foot surgery    . Back surgery      FAMILY HISTORY: History reviewed. No pertinent family history.  SOCIAL HISTORY:  reports that she has never smoked. She does not have any smokeless tobacco history on file. She reports that she does not drink alcohol or use illicit drugs.  PERFORMANCE STATUS: The patient's performance status is 4 - Bedbound  PHYSICAL EXAM: Most Recent Vital Signs: Blood pressure 127/60, pulse 72, temperature 98 F (36.7 C), temperature source Oral, resp. rate 20,  height _0  (1.575 m), weight 161 lb 6 oz (73.2 kg), SpO2 97.00%. General appearance: alert, cooperative, appears stated age and no distress Head: Normocephalic, without obvious abnormality Eyes: negative findings: lids and lashes normal, conjunctivae and sclerae normal and corneas clear, positive findings: eyelids/periorbital: ecchymosis on the right Neck: supple, symmetrical,  trachea midline Lungs: clear to auscultation bilaterally Heart: regular rate and rhythm Abdomen: soft, non-tender; bowel sounds normal; no masses,  no organomegaly Skin: Skin color, texture, turgor normal. No rashes or lesions Neurologic: Grossly normal  LABORATORY DATA:  Results for orders placed during the hospital encounter of 02/06/14 (from the past 48 hour(s))  CBC WITH DIFFERENTIAL     Status: Abnormal   Collection Time    02/06/14  2:11 PM      Result Value Ref Range   WBC 14.1 (*) 4.0 - 10.5 K/uL   RBC 4.71  3.87 - 5.11 MIL/uL   Hemoglobin 14.1  12.0 - 15.0 g/dL   HCT 41.2  36.0 - 46.0 %   MCV 87.5  78.0 - 100.0 fL   MCH 29.9  26.0 - 34.0 pg   MCHC 34.2  30.0 - 36.0 g/dL   RDW 14.2  11.5 - 15.5 %   Platelets 274  150 - 400 K/uL   Neutrophils Relative % 60  43 - 77 %   Lymphocytes Relative 30  12 - 46 %   Monocytes Relative 9  3 - 12 %   Eosinophils Relative 1  0 - 5 %   Basophils Relative 0  0 - 1 %   Neutro Abs 8.5 (*) 1.7 - 7.7 K/uL   Lymphs Abs 4.2 (*) 0.7 - 4.0 K/uL   Monocytes Absolute 1.3 (*) 0.1 - 1.0 K/uL   Eosinophils Absolute 0.1  0.0 - 0.7 K/uL   Basophils Absolute 0.0  0.0 - 0.1 K/uL   WBC Morphology INCREASED BANDS (>20% BANDS)     Smear Review LARGE PLATELETS PRESENT     Comment: GIANT PLATELETS SEEN  COMPREHENSIVE METABOLIC PANEL     Status: Abnormal   Collection Time    02/06/14  2:11 PM      Result Value Ref Range   Sodium 141  137 - 147 mEq/L   Potassium 3.9  3.7 - 5.3 mEq/L   Chloride 100  96 - 112 mEq/L   CO2 24  19 - 32 mEq/L   Glucose, Bld 136 (*) 70 - 99 mg/dL   BUN 15  6 - 23 mg/dL   Creatinine, Ser 0.69  0.50 - 1.10 mg/dL   Calcium 9.9  8.4 - 10.5 mg/dL   Total Protein 7.7  6.0 - 8.3 g/dL   Albumin 4.4  3.5 - 5.2 g/dL   AST 35  0 - 37 U/L   ALT 28  0 - 35 U/L   Alkaline Phosphatase 56  39 - 117 U/L   Total Bilirubin 0.4  0.3 - 1.2 mg/dL   GFR calc non Af Amer 81 (*) >90 mL/min   GFR calc Af Amer >90  >90 mL/min   Comment: (NOTE)      The eGFR has been calculated using the CKD EPI equation.     This calculation has not been validated in all clinical situations.     eGFR's persistently <90 mL/min signify possible Chronic Kidney     Disease.   Anion gap 17 (*) 5 - 15  URINALYSIS, ROUTINE W REFLEX MICROSCOPIC     Status: Abnormal  Collection Time    02/06/14  4:41 PM      Result Value Ref Range   Color, Urine YELLOW  YELLOW   APPearance CLEAR  CLEAR   Specific Gravity, Urine 1.015  1.005 - 1.030   pH 5.5  5.0 - 8.0   Glucose, UA NEGATIVE  NEGATIVE mg/dL   Hgb urine dipstick NEGATIVE  NEGATIVE   Bilirubin Urine NEGATIVE  NEGATIVE   Ketones, ur TRACE (*) NEGATIVE mg/dL   Protein, ur NEGATIVE  NEGATIVE mg/dL   Urobilinogen, UA 0.2  0.0 - 1.0 mg/dL   Nitrite NEGATIVE  NEGATIVE   Leukocytes, UA NEGATIVE  NEGATIVE   Comment: MICROSCOPIC NOT DONE ON URINES WITH NEGATIVE PROTEIN, BLOOD, LEUKOCYTES, NITRITE, OR GLUCOSE <1000 mg/dL.  GLUCOSE, CAPILLARY     Status: Abnormal   Collection Time    02/06/14  8:22 PM      Result Value Ref Range   Glucose-Capillary 131 (*) 70 - 99 mg/dL   Comment 1 Documented in Chart     Comment 2 Notify RN    COMPREHENSIVE METABOLIC PANEL     Status: Abnormal   Collection Time    02/07/14  5:13 AM      Result Value Ref Range   Sodium 141  137 - 147 mEq/L   Potassium 3.9  3.7 - 5.3 mEq/L   Chloride 102  96 - 112 mEq/L   CO2 27  19 - 32 mEq/L   Glucose, Bld 160 (*) 70 - 99 mg/dL   BUN 10  6 - 23 mg/dL   Creatinine, Ser 0.64  0.50 - 1.10 mg/dL   Calcium 9.2  8.4 - 10.5 mg/dL   Total Protein 7.0  6.0 - 8.3 g/dL   Albumin 3.8  3.5 - 5.2 g/dL   AST 26  0 - 37 U/L   ALT 24  0 - 35 U/L   Alkaline Phosphatase 52  39 - 117 U/L   Total Bilirubin 0.5  0.3 - 1.2 mg/dL   GFR calc non Af Amer 83 (*) >90 mL/min   GFR calc Af Amer >90  >90 mL/min   Comment: (NOTE)     The eGFR has been calculated using the CKD EPI equation.     This calculation has not been validated in all clinical  situations.     eGFR's persistently <90 mL/min signify possible Chronic Kidney     Disease.   Anion gap 12  5 - 15  CBC     Status: Abnormal   Collection Time    02/07/14  5:13 AM      Result Value Ref Range   WBC 11.1 (*) 4.0 - 10.5 K/uL   RBC 4.29  3.87 - 5.11 MIL/uL   Hemoglobin 12.9  12.0 - 15.0 g/dL   HCT 37.9  36.0 - 46.0 %   MCV 88.3  78.0 - 100.0 fL   MCH 30.1  26.0 - 34.0 pg   MCHC 34.0  30.0 - 36.0 g/dL   RDW 14.4  11.5 - 15.5 %   Platelets 276  150 - 400 K/uL  GLUCOSE, CAPILLARY     Status: Abnormal   Collection Time    02/07/14  8:14 AM      Result Value Ref Range   Glucose-Capillary 119 (*) 70 - 99 mg/dL      RADIOGRAPHY: Dg Chest 1 View  02/06/2014   CLINICAL DATA:  Fall, pain  EXAM: CHEST - 1 VIEW  COMPARISON:  None.  FINDINGS: The aorta is unfolded and ectatic. Moderate enlargement of the cardiac silhouette is noted. Prominence of the central pulmonary arteries with rapid tapering may suggest pulmonary arterial hypertension. No pleural effusion. No acute osseous finding.  IMPRESSION: Cardiomegaly with possible pulmonary arterial hypertension but no focal acute finding allowing for one view technique.   Electronically Signed   By: Conchita Paris M.D.   On: 02/06/2014 15:20   Dg Hip Complete Right  02/06/2014   CLINICAL DATA:  Fall  EXAM: RIGHT HIP - COMPLETE 2+ VIEW  COMPARISON:  None.  FINDINGS: There is a displaced fracture through the right femoral neck. The femoral head appears appropriately located. Lower lumbar spine degenerative changes. SI joints unremarkable. Pelvic phleboliths.  IMPRESSION: Displaced right femoral neck fracture.   Electronically Signed   By: Lovey Newcomer M.D.   On: 02/06/2014 15:21   Ct Head Wo Contrast  02/06/2014   CLINICAL DATA:  Status post fall with hematoma over the right eyebrow  EXAM: CT HEAD WITHOUT CONTRAST  CT CERVICAL SPINE WITHOUT CONTRAST  TECHNIQUE: Multidetector CT imaging of the head and cervical spine was performed following  the standard protocol without intravenous contrast. Multiplanar CT image reconstructions of the cervical spine were also generated.  COMPARISON:  None.  FINDINGS: CT HEAD FINDINGS  The ventricles are normal in size and position. There is no intracranial hemorrhage nor intracranial mass effect. There is no acute ischemic change. The cerebellum and brainstem are normal. The globes and other orbital soft tissues are normal with the exception of preseptal edema on the right. At bone window settings the observed paranasal sinuses and mastoid air cells are clear. There is a large cephalohematoma over the right forehead. There is no acute skull fracture.  CT CERVICAL SPINE FINDINGS  The cervical vertebral bodies are preserved in height. There is mild disc space narrowing at C3-4, C4-5, and C6-7. The prevertebral soft tissue spaces are normal. There is no perched facet nor facet or spinous process fracture. The odontoid is intact.  IMPRESSION: 1. There is a large right-sided cephalohematoma. There is no acute intracranial hemorrhage nor other acute intracranial abnormality. There is no skull fracture. 2. There is no acute cervical spine fracture nor dislocation. There is degenerative disc change at multiple levels.   Electronically Signed   By: David  Martinique   On: 02/06/2014 15:43   Ct Cervical Spine Wo Contrast  02/06/2014   CLINICAL DATA:  Status post fall with hematoma over the right eyebrow  EXAM: CT HEAD WITHOUT CONTRAST  CT CERVICAL SPINE WITHOUT CONTRAST  TECHNIQUE: Multidetector CT imaging of the head and cervical spine was performed following the standard protocol without intravenous contrast. Multiplanar CT image reconstructions of the cervical spine were also generated.  COMPARISON:  None.  FINDINGS: CT HEAD FINDINGS  The ventricles are normal in size and position. There is no intracranial hemorrhage nor intracranial mass effect. There is no acute ischemic change. The cerebellum and brainstem are normal. The  globes and other orbital soft tissues are normal with the exception of preseptal edema on the right. At bone window settings the observed paranasal sinuses and mastoid air cells are clear. There is a large cephalohematoma over the right forehead. There is no acute skull fracture.  CT CERVICAL SPINE FINDINGS  The cervical vertebral bodies are preserved in height. There is mild disc space narrowing at C3-4, C4-5, and C6-7. The prevertebral soft tissue spaces are normal. There is no perched facet nor facet or spinous  process fracture. The odontoid is intact.  IMPRESSION: 1. There is a large right-sided cephalohematoma. There is no acute intracranial hemorrhage nor other acute intracranial abnormality. There is no skull fracture. 2. There is no acute cervical spine fracture nor dislocation. There is degenerative disc change at multiple levels.   Electronically Signed   By: David  Martinique   On: 02/06/2014 15:43       PATHOLOGY:  None   ASSESSMENT:  1. Right displaced femoral fracture, secondary to fall 2. Pain secondary to #1 3. Reactive leukocytosis 4. History of ITP, treated by Dr. Jerrye Noble at [facility] with splenectomy in 2002, in complete remission.  Appropriately immunized prior to splenectomy. 5. History of unstaged poorly differentiated ovarian cancer, S/P surgery and 6 cycles of systemic chemotherapy finishing all treatment in 2003-2004 per [facility] records review.  Followed by Dr. Rayford Halsted at [facility] department. 7. Poor historian.  Patient Active Problem List   Diagnosis Date Noted  . Traumatic hematoma of head 02/07/2014  . Spinal stenosis 02/07/2014  . Fracture of femoral neck, right 02/06/2014  . HTN (hypertension) 02/06/2014  . Type 2 diabetes mellitus without complication 63/89/3734  . Hypothyroidism 02/06/2014     PLAN:  1. I personally reviewed and went over laboratory results with the patient.  The results are noted within this dictation. 2. I personally reviewed and  went over radiographic studies with the patient.  The results are noted within this dictation.   3. Labs today: CA 125 4. Recommend biopsy/scraping of bone at site of fracture at the time of ORIF to rule out pathologic fracture.   5. No role for immunizations prior-to or following surgery.  Immunized appropriately prior to splenectomy per patient. 6. Recommend typical operative and post-op antibiotics as deemed appropriate 7. Recommend typical DVT prophylaxis. 8. No particular recommendations from a hem/onc standpoint for her history of ITP, splenectomy, and history of ovarian cancer >10 years ago prior-to, during, or post-operatively.  Treat and manage in the typical orthopedic fashion (except for request of biopsy at fracture site to rule out pathologic fracture). 9. Hematology will sign-off at this time.  Please re-consult if needed. [facility] notes reviewed in detail 11. Follow-up with Gyn Onc at [facility] as scheduled, sooner if biopsy of fracture is positive for malignancy.  All questions were answered. The patient knows to call the clinic with any problems, questions or concerns. We can certainly see the patient much sooner if necessary.  Patient and plan discussed with Dr. Farrel Gobble and he is in agreement with the aforementioned.   KEFALAS,THOMAS 02/07/2014

## 2014-02-07 NOTE — Consult Note (Signed)
Reason for Consult: Right hip fracture  Referring Physician:   [name] is an 80 y.o. female.  HPI: 80 year-old female with history of spinal stenosis scheduled to see Dr. Carloyn Manner tomorrow fell at a local [employer] store when her legs gave way. She's had frequent giving out symptoms of the lower extremity secondary to spinal stenosis. She's had physical therapy and epidural steroid injections with no relief. She did use a cane on occasion but did not have it when she fell. She has fractured her right hip. She complains of nonradiating, dull aching excruciating pain in the right hip and cannot ambulate. Her leg is shortened and externally rotated.  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 We will get hematology involved to determine if another vaccine as needed and also to make recommendations for postop DVT prevention.  Past Medical History  Diagnosis Date  . Arthritis   . Anxiety   . Thyroid disease   . Hypertension   . Diabetes mellitus without complication   . Cancer     ovarian    Past Surgical History  Procedure Laterality Date  . Splenectomy, total    . Abdominal hysterectomy    . Appendectomy    . Hand surgery    . Foot surgery    . Back surgery      History reviewed. No pertinent family history.  Social History:  reports that she has never smoked. She does not have any smokeless tobacco history on file. She reports that she does not drink alcohol or use illicit drugs.  Allergies:  Allergies  Allergen Reactions  . Ivp Dye [Iodinated Diagnostic Agents] Hives  . Penicillins Hives  . Sulfa Antibiotics Nausea And Vomiting    Medications: I have reviewed the patient's current medications.  Results for orders placed during the hospital encounter of 02/06/14 (from the past 48 hour(s))  CBC WITH DIFFERENTIAL     Status: Abnormal   Collection Time    02/06/14  2:11 PM      Result Value Ref Range   WBC 14.1 (*) 4.0 - 10.5 K/uL   RBC 4.71   3.87 - 5.11 MIL/uL   Hemoglobin 14.1  12.0 - 15.0 g/dL   HCT 41.2  36.0 - 46.0 %   MCV 87.5  78.0 - 100.0 fL   MCH 29.9  26.0 - 34.0 pg   MCHC 34.2  30.0 - 36.0 g/dL   RDW 14.2  11.5 - 15.5 %   Platelets 274  150 - 400 K/uL   Neutrophils Relative % 60  43 - 77 %   Lymphocytes Relative 30  12 - 46 %   Monocytes Relative 9  3 - 12 %   Eosinophils Relative 1  0 - 5 %   Basophils Relative 0  0 - 1 %   Neutro Abs 8.5 (*) 1.7 - 7.7 K/uL   Lymphs Abs 4.2 (*) 0.7 - 4.0 K/uL   Monocytes Absolute 1.3 (*) 0.1 - 1.0 K/uL   Eosinophils Absolute 0.1  0.0 - 0.7 K/uL   Basophils Absolute 0.0  0.0 - 0.1 K/uL   WBC Morphology INCREASED BANDS (>20% BANDS)     Smear Review LARGE PLATELETS PRESENT     Comment: GIANT PLATELETS SEEN  COMPREHENSIVE METABOLIC PANEL     Status: Abnormal   Collection Time    02/06/14  2:11 PM      Result Value Ref Range   Sodium 141  137 - 147 mEq/L   Potassium 3.9  3.7 - 5.3 mEq/L   Chloride 100  96 - 112 mEq/L   CO2 24  19 - 32 mEq/L   Glucose, Bld 136 (*) 70 - 99 mg/dL   BUN 15  6 - 23 mg/dL   Creatinine, Ser 0.69  0.50 - 1.10 mg/dL   Calcium 9.9  8.4 - 10.5 mg/dL   Total Protein 7.7  6.0 - 8.3 g/dL   Albumin 4.4  3.5 - 5.2 g/dL   AST 35  0 - 37 U/L   ALT 28  0 - 35 U/L   Alkaline Phosphatase 56  39 - 117 U/L   Total Bilirubin 0.4  0.3 - 1.2 mg/dL   GFR calc non Af Amer 81 (*) >90 mL/min   GFR calc Af Amer >90  >90 mL/min   Comment: (NOTE)     The eGFR has been calculated using the CKD EPI equation.     This calculation has not been validated in all clinical situations.     eGFR's persistently <90 mL/min signify possible Chronic Kidney     Disease.   Anion gap 17 (*) 5 - 15  URINALYSIS, ROUTINE W REFLEX MICROSCOPIC     Status: Abnormal   Collection Time    02/06/14  4:41 PM      Result Value Ref Range   Color, Urine YELLOW  YELLOW   APPearance CLEAR  CLEAR   Specific Gravity, Urine 1.015  1.005 - 1.030   pH 5.5  5.0 - 8.0   Glucose, UA NEGATIVE   NEGATIVE mg/dL   Hgb urine dipstick NEGATIVE  NEGATIVE   Bilirubin Urine NEGATIVE  NEGATIVE   Ketones, ur TRACE (*) NEGATIVE mg/dL   Protein, ur NEGATIVE  NEGATIVE mg/dL   Urobilinogen, UA 0.2  0.0 - 1.0 mg/dL   Nitrite NEGATIVE  NEGATIVE   Leukocytes, UA NEGATIVE  NEGATIVE   Comment: MICROSCOPIC NOT DONE ON URINES WITH NEGATIVE PROTEIN, BLOOD, LEUKOCYTES, NITRITE, OR GLUCOSE <1000 mg/dL.  GLUCOSE, CAPILLARY     Status: Abnormal   Collection Time    02/06/14  8:22 PM      Result Value Ref Range   Glucose-Capillary 131 (*) 70 - 99 mg/dL   Comment 1 Documented in Chart     Comment 2 Notify RN    COMPREHENSIVE METABOLIC PANEL     Status: Abnormal   Collection Time    02/07/14  5:13 AM      Result Value Ref Range   Sodium 141  137 - 147 mEq/L   Potassium 3.9  3.7 - 5.3 mEq/L   Chloride 102  96 - 112 mEq/L   CO2 27  19 - 32 mEq/L   Glucose, Bld 160 (*) 70 - 99 mg/dL   BUN 10  6 - 23 mg/dL   Creatinine, Ser 0.64  0.50 - 1.10 mg/dL   Calcium 9.2  8.4 - 10.5 mg/dL   Total Protein 7.0  6.0 - 8.3 g/dL   Albumin 3.8  3.5 - 5.2 g/dL   AST 26  0 - 37 U/L   ALT 24  0 - 35 U/L   Alkaline Phosphatase 52  39 - 117 U/L   Total Bilirubin 0.5  0.3 - 1.2 mg/dL   GFR calc non Af Amer 83 (*) >90 mL/min   GFR calc Af Amer >90  >90 mL/min   Comment: (NOTE)     The eGFR has been calculated using  the CKD EPI equation.     This calculation has not been validated in all clinical situations.     eGFR's persistently <90 mL/min signify possible Chronic Kidney     Disease.   Anion gap 12  5 - 15  CBC     Status: Abnormal   Collection Time    02/07/14  5:13 AM      Result Value Ref Range   WBC 11.1 (*) 4.0 - 10.5 K/uL   RBC 4.29  3.87 - 5.11 MIL/uL   Hemoglobin 12.9  12.0 - 15.0 g/dL   HCT 37.9  36.0 - 46.0 %   MCV 88.3  78.0 - 100.0 fL   MCH 30.1  26.0 - 34.0 pg   MCHC 34.0  30.0 - 36.0 g/dL   RDW 14.4  11.5 - 15.5 %   Platelets 276  150 - 400 K/uL    Dg Chest 1 View  02/06/2014   CLINICAL  DATA:  Fall, pain  EXAM: CHEST - 1 VIEW  COMPARISON:  None.  FINDINGS: The aorta is unfolded and ectatic. Moderate enlargement of the cardiac silhouette is noted. Prominence of the central pulmonary arteries with rapid tapering may suggest pulmonary arterial hypertension. No pleural effusion. No acute osseous finding.  IMPRESSION: Cardiomegaly with possible pulmonary arterial hypertension but no focal acute finding allowing for one view technique.   Electronically Signed   By: Conchita Paris M.D.   On: 02/06/2014 15:20   Dg Hip Complete Right  02/06/2014   CLINICAL DATA:  Fall  EXAM: RIGHT HIP - COMPLETE 2+ VIEW  COMPARISON:  None.  FINDINGS: There is a displaced fracture through the right femoral neck. The femoral head appears appropriately located. Lower lumbar spine degenerative changes. SI joints unremarkable. Pelvic phleboliths.  IMPRESSION: Displaced right femoral neck fracture.   Electronically Signed   By: Lovey Newcomer M.D.   On: 02/06/2014 15:21   Ct Head Wo Contrast  02/06/2014   CLINICAL DATA:  Status post fall with hematoma over the right eyebrow  EXAM: CT HEAD WITHOUT CONTRAST  CT CERVICAL SPINE WITHOUT CONTRAST  TECHNIQUE: Multidetector CT imaging of the head and cervical spine was performed following the standard protocol without intravenous contrast. Multiplanar CT image reconstructions of the cervical spine were also generated.  COMPARISON:  None.  FINDINGS: CT HEAD FINDINGS  The ventricles are normal in size and position. There is no intracranial hemorrhage nor intracranial mass effect. There is no acute ischemic change. The cerebellum and brainstem are normal. The globes and other orbital soft tissues are normal with the exception of preseptal edema on the right. At bone window settings the observed paranasal sinuses and mastoid air cells are clear. There is a large cephalohematoma over the right forehead. There is no acute skull fracture.  CT CERVICAL SPINE FINDINGS  The cervical vertebral  bodies are preserved in height. There is mild disc space narrowing at C3-4, C4-5, and C6-7. The prevertebral soft tissue spaces are normal. There is no perched facet nor facet or spinous process fracture. The odontoid is intact.  IMPRESSION: 1. There is a large right-sided cephalohematoma. There is no acute intracranial hemorrhage nor other acute intracranial abnormality. There is no skull fracture. 2. There is no acute cervical spine fracture nor dislocation. There is degenerative disc change at multiple levels.   Electronically Signed   By: David  Martin[place]   On: 02/06/2014 15:43   Ct Cervical Spine Wo Contrast  02/06/2014   CLINICAL DATA:  Status  post fall with hematoma over the right eyebrow  EXAM: CT HEAD WITHOUT CONTRAST  CT CERVICAL SPINE WITHOUT CONTRAST  TECHNIQUE: Multidetector CT imaging of the head and cervical spine was performed following the standard protocol without intravenous contrast. Multiplanar CT image reconstructions of the cervical spine were also generated.  COMPARISON:  None.  FINDINGS: CT HEAD FINDINGS  The ventricles are normal in size and position. There is no intracranial hemorrhage nor intracranial mass effect. There is no acute ischemic change. The cerebellum and brainstem are normal. The globes and other orbital soft tissues are normal with the exception of preseptal edema on the right. At bone window settings the observed paranasal sinuses and mastoid air cells are clear. There is a large cephalohematoma over the right forehead. There is no acute skull fracture.  CT CERVICAL SPINE FINDINGS  The cervical vertebral bodies are preserved in height. There is mild disc space narrowing at C3-4, C4-5, and C6-7. The prevertebral soft tissue spaces are normal. There is no perched facet nor facet or spinous process fracture. The odontoid is intact.  IMPRESSION: 1. There is a large right-sided cephalohematoma. There is no acute intracranial hemorrhage nor other acute intracranial abnormality.  There is no skull fracture. 2. There is no acute cervical spine fracture nor dislocation. There is degenerative disc change at multiple levels.   Electronically Signed   By: David  [place]   On: 02/06/2014 15:43    ROS Blood pressure 127/60, pulse 72, temperature 98 F (36.7 C), temperature source Oral, resp. rate 20, height 5' 2"  (1.575 m), weight 161 lb 6 oz (73.2 kg), SpO2 97.00%. Physical Exam  THE PAST FAMILY, MEDICAL, SURGICAL AND SOCIAL HISTORY HAS BEEN REVIEWED AND RECORDED IN PROVIDED SECTIONS OF EPIC.  PHYSICAL EXAM  The patient is a large bruise and hematoma over the right eye socket with no visual loss. She has no other head trauma. Neck is supple nontender. Upper extremities show no fracture subluxation atrophy tremor or malalignment. Muscle tone is normal. Well-developed well-nourished no congenital deformities.  She is awake alert and oriented x3 mood and affect is normal.  Ambulation confined to bed secondary to right hip fracture  Has good distal pulses normal perfusion are feet are warm to touch has good radial and ulnar pulses bilaterally. Dorsalis pedis pulses are intact as well. She is no sensory loss in her feet or hands. She is no lymphadenopathy. Skin is warm dry and intact without rash lesion or ulceration  Her left leg is in normal alignment, muscle tone and strength are normal. Hip knee and ankle are reduced and stable. Motor grade 5.  Her right leg is in external rotation is tenderness and swelling over the proximal thigh and hip. Range of motion and stability tests were deferred, knee and ankle are reduced and stable. Muscle tone is normal   Assessment/Plan: Right femoral neck fracture  Recommend right bipolar hip replacement Consults hematology  Arther Abbott 02/07/2014, 7:52 AM

## 2014-02-08 ENCOUNTER — Encounter (HOSPITAL_COMMUNITY): Payer: Self-pay | Admitting: *Deleted

## 2014-02-08 ENCOUNTER — Inpatient Hospital Stay (HOSPITAL_COMMUNITY): Payer: Medicare Other | Admitting: Anesthesiology

## 2014-02-08 ENCOUNTER — Encounter (HOSPITAL_COMMUNITY): Payer: Medicare Other | Admitting: Anesthesiology

## 2014-02-08 ENCOUNTER — Inpatient Hospital Stay (HOSPITAL_COMMUNITY): Payer: Medicare Other

## 2014-02-08 ENCOUNTER — Encounter (HOSPITAL_COMMUNITY)
Admission: EM | Disposition: A | Discharge status: Skilled Nursing Facility | Payer: Self-pay | Source: Home / Self Care | Attending: Internal Medicine

## 2014-02-08 DIAGNOSIS — R0902 Hypoxemia: Secondary | ICD-10-CM | POA: Diagnosis not present

## 2014-02-08 DIAGNOSIS — S72009A Fracture of unspecified part of neck of unspecified femur, initial encounter for closed fracture: Secondary | ICD-10-CM | POA: Insufficient documentation

## 2014-02-08 DIAGNOSIS — Z9081 Acquired absence of spleen: Secondary | ICD-10-CM

## 2014-02-08 HISTORY — PX: HIP ARTHROPLASTY: SHX981

## 2014-02-08 LAB — CBC
HCT: 38.7 % (ref 36.0–46.0)
HCT: 35.3 % — ABNORMAL LOW (ref 36.0–46.0)
Hemoglobin: 13 g/dL (ref 12.0–15.0)
Hemoglobin: 12 g/dL (ref 12.0–15.0)
MCH: 30.4 pg (ref 26.0–34.0)
MCH: 30.5 pg (ref 26.0–34.0)
MCHC: 33.6 g/dL (ref 30.0–36.0)
MCHC: 34 g/dL (ref 30.0–36.0)
MCV: 90.4 fL (ref 78.0–100.0)
MCV: 89.6 fL (ref 78.0–100.0)
PLATELETS: 290 10*3/uL (ref 150–400)
PLATELETS: 243 10*3/uL (ref 150–400)
RBC: 4.28 MIL/uL (ref 3.87–5.11)
RBC: 3.94 MIL/uL (ref 3.87–5.11)
RDW: 14.9 % (ref 11.5–15.5)
RDW: 14.6 % (ref 11.5–15.5)
WBC: 18.5 10*3/uL — ABNORMAL HIGH (ref 4.0–10.5)
WBC: 19.5 10*3/uL — AB (ref 4.0–10.5)

## 2014-02-08 LAB — GLUCOSE, CAPILLARY
Glucose-Capillary: 199 mg/dL — ABNORMAL HIGH (ref 70–99)
Glucose-Capillary: 175 mg/dL — ABNORMAL HIGH (ref 70–99)
Glucose-Capillary: 151 mg/dL — ABNORMAL HIGH (ref 70–99)
Glucose-Capillary: 149 mg/dL — ABNORMAL HIGH (ref 70–99)
Glucose-Capillary: 156 mg/dL — ABNORMAL HIGH (ref 70–99)

## 2014-02-08 LAB — PREPARE RBC (CROSSMATCH)

## 2014-02-08 LAB — TSH: TSH: 0.225 u[IU]/mL — AB (ref 0.350–4.500)

## 2014-02-08 SURGERY — HEMIARTHROPLASTY, HIP, DIRECT ANTERIOR APPROACH, FOR FRACTURE
Anesthesia: Spinal | Site: Hip | Laterality: Right

## 2014-02-08 MED ORDER — PHENOL 1.4 % MT LIQD
1.0000 | OROMUCOSAL | Status: DC | PRN
  Filled 2014-02-08: qty 177

## 2014-02-08 MED ORDER — ACETAMINOPHEN 325 MG PO TABS: 650.0000 mg | ORAL_TABLET | Freq: Four times a day (QID) | ORAL | Status: DC | PRN

## 2014-02-08 MED ORDER — DOCUSATE SODIUM 100 MG PO CAPS
100.0000 mg | ORAL_CAPSULE | Freq: Two times a day (BID) | ORAL | Status: DC
  Administered 2014-02-08 – 2014-02-12 (×9): 100 mg via ORAL
  Filled 2014-02-08 (×9): qty 1

## 2014-02-08 MED ORDER — STERILE WATER FOR IRRIGATION IR SOLN
Status: DC | PRN
  Administered 2014-02-08 (×2): 1000 mL

## 2014-02-08 MED ORDER — HYDROCODONE-ACETAMINOPHEN 5-325 MG PO TABS
1.0000 | ORAL_TABLET | Freq: Four times a day (QID) | ORAL | Status: DC | PRN
  Administered 2014-02-08: 2 via ORAL
  Administered 2014-02-09 (×2): 1 via ORAL
  Administered 2014-02-09: 2 via ORAL
  Filled 2014-02-08 (×2): qty 2
  Filled 2014-02-08 (×2): qty 1

## 2014-02-08 MED ORDER — PROPOFOL INFUSION 10 MG/ML OPTIME
INTRAVENOUS | Status: DC | PRN
  Administered 2014-02-08: 75 ug/kg/min via INTRAVENOUS

## 2014-02-08 MED ORDER — ARTIFICIAL TEARS OP OINT
TOPICAL_OINTMENT | OPHTHALMIC | Status: AC
  Filled 2014-02-08: qty 3.5

## 2014-02-08 MED ORDER — BUPIVACAINE-EPINEPHRINE (PF) 0.5% -1:200000 IJ SOLN
INTRAMUSCULAR | Status: DC | PRN
  Administered 2014-02-08 (×2): 30 mL

## 2014-02-08 MED ORDER — METOCLOPRAMIDE HCL 5 MG/ML IJ SOLN: 5.0000 mg | Freq: Three times a day (TID) | INTRAMUSCULAR | Status: DC | PRN

## 2014-02-08 MED ORDER — FENTANYL CITRATE 0.05 MG/ML IJ SOLN
INTRAMUSCULAR | Status: DC | PRN
  Administered 2014-02-08 (×2): 25 ug via INTRAVENOUS
  Administered 2014-02-08: 30 ug via INTRAVENOUS
  Administered 2014-02-08: 20 ug via INTRATHECAL

## 2014-02-08 MED ORDER — FENTANYL CITRATE 0.05 MG/ML IJ SOLN
INTRAMUSCULAR | Status: AC
  Filled 2014-02-08: qty 2

## 2014-02-08 MED ORDER — LACTATED RINGERS IV SOLN
INTRAVENOUS | Status: DC | PRN
  Administered 2014-02-08 (×2): via INTRAVENOUS

## 2014-02-08 MED ORDER — CLINDAMYCIN PHOSPHATE 600 MG/50ML IV SOLN
600.0000 mg | Freq: Four times a day (QID) | INTRAVENOUS | Status: AC
  Administered 2014-02-08 (×2): 600 mg via INTRAVENOUS
  Filled 2014-02-08 (×2): qty 50

## 2014-02-08 MED ORDER — SENNOSIDES-DOCUSATE SODIUM 8.6-50 MG PO TABS
1.0000 | ORAL_TABLET | Freq: Every evening | ORAL | Status: DC | PRN
  Administered 2014-02-10 (×3): 1 via ORAL
  Filled 2014-02-08: qty 1

## 2014-02-08 MED ORDER — EPHEDRINE SULFATE 50 MG/ML IJ SOLN
INTRAMUSCULAR | Status: DC | PRN
  Administered 2014-02-08 (×2): 5 mg via INTRAVENOUS

## 2014-02-08 MED ORDER — PROPOFOL 10 MG/ML IV BOLUS
INTRAVENOUS | Status: AC
  Filled 2014-02-08: qty 20

## 2014-02-08 MED ORDER — SODIUM CHLORIDE 0.9 % IR SOLN
Status: DC | PRN
  Administered 2014-02-08 (×2): 1000 mL

## 2014-02-08 MED ORDER — SODIUM CHLORIDE 0.9 % IJ SOLN
INTRAMUSCULAR | Status: AC
  Filled 2014-02-08: qty 10

## 2014-02-08 MED ORDER — ONDANSETRON HCL 4 MG/2ML IJ SOLN: 4.0000 mg | Freq: Four times a day (QID) | INTRAMUSCULAR | Status: DC | PRN

## 2014-02-08 MED ORDER — BUPIVACAINE IN DEXTROSE 0.75-8.25 % IT SOLN
INTRATHECAL | Status: DC | PRN
  Administered 2014-02-08: 13 mg via INTRATHECAL

## 2014-02-08 MED ORDER — LIDOCAINE HCL (PF) 1 % IJ SOLN
INTRAMUSCULAR | Status: AC
  Filled 2014-02-08: qty 5

## 2014-02-08 MED ORDER — LACTATED RINGERS IV SOLN
INTRAVENOUS | Status: DC
  Administered 2014-02-08: 09:00:00 via INTRAVENOUS

## 2014-02-08 MED ORDER — ONDANSETRON HCL 4 MG/2ML IJ SOLN
INTRAMUSCULAR | Status: AC
  Filled 2014-02-08: qty 2

## 2014-02-08 MED ORDER — HYDROMORPHONE HCL 1 MG/ML IJ SOLN
0.5000 mg | INTRAMUSCULAR | Status: DC | PRN
  Administered 2014-02-08 – 2014-02-09 (×7): 0.5 mg via INTRAVENOUS
  Filled 2014-02-08 (×7): qty 1

## 2014-02-08 MED ORDER — BUPIVACAINE-EPINEPHRINE (PF) 0.5% -1:200000 IJ SOLN
INTRAMUSCULAR | Status: AC
  Filled 2014-02-08: qty 60

## 2014-02-08 MED ORDER — FENTANYL CITRATE 0.05 MG/ML IJ SOLN
25.0000 ug | INTRAMUSCULAR | Status: DC
  Administered 2014-02-08: 25 ug via INTRAVENOUS

## 2014-02-08 MED ORDER — ALUM & MAG HYDROXIDE-SIMETH 200-200-20 MG/5ML PO SUSP: 30.0000 mL | ORAL | Status: DC | PRN

## 2014-02-08 MED ORDER — ACETAMINOPHEN 650 MG RE SUPP: 650.0000 mg | Freq: Four times a day (QID) | RECTAL | Status: DC | PRN

## 2014-02-08 MED ORDER — MORPHINE SULFATE 4 MG/ML IJ SOLN
0.5000 mg | INTRAMUSCULAR | Status: DC | PRN
  Administered 2014-02-08: 0.52 mg via INTRAVENOUS
  Filled 2014-02-08: qty 1

## 2014-02-08 MED ORDER — ONDANSETRON HCL 4 MG/2ML IJ SOLN
4.0000 mg | Freq: Once | INTRAMUSCULAR | Status: AC
  Administered 2014-02-08: 4 mg via INTRAVENOUS

## 2014-02-08 MED ORDER — MIDAZOLAM HCL 2 MG/2ML IJ SOLN
1.0000 mg | INTRAMUSCULAR | Status: DC | PRN
  Administered 2014-02-08: 1 mg via INTRAVENOUS

## 2014-02-08 MED ORDER — PHENYLEPHRINE HCL 10 MG/ML IJ SOLN
INTRAMUSCULAR | Status: DC | PRN
  Administered 2014-02-08 (×4): 100 ug via INTRAVENOUS

## 2014-02-08 MED ORDER — FENTANYL CITRATE 0.05 MG/ML IJ SOLN
25.0000 ug | INTRAMUSCULAR | Status: DC | PRN
  Administered 2014-02-08: 25 ug via INTRAVENOUS
  Administered 2014-02-08: 50 ug via INTRAVENOUS
  Filled 2014-02-08: qty 2

## 2014-02-08 MED ORDER — SODIUM CHLORIDE 0.9 % IV SOLN
INTRAVENOUS | Status: DC
  Administered 2014-02-08 – 2014-02-10 (×3): via INTRAVENOUS

## 2014-02-08 MED ORDER — MIDAZOLAM HCL 2 MG/2ML IJ SOLN
INTRAMUSCULAR | Status: AC
  Filled 2014-02-08: qty 2

## 2014-02-08 MED ORDER — BISACODYL 5 MG PO TBEC
5.0000 mg | DELAYED_RELEASE_TABLET | Freq: Every day | ORAL | Status: DC | PRN
  Administered 2014-02-11: 5 mg via ORAL
  Filled 2014-02-08: qty 1

## 2014-02-08 MED ORDER — FLEET ENEMA 7-19 GM/118ML RE ENEM: 1.0000 | ENEMA | Freq: Once | RECTAL | Status: AC | PRN

## 2014-02-08 MED ORDER — ONDANSETRON HCL 4 MG/2ML IJ SOLN
4.0000 mg | Freq: Once | INTRAMUSCULAR | Status: DC | PRN
Start: 2014-02-08 — End: 2014-02-08

## 2014-02-08 MED ORDER — SENNA 8.6 MG PO TABS
1.0000 | ORAL_TABLET | Freq: Two times a day (BID) | ORAL | Status: DC
  Administered 2014-02-08 – 2014-02-12 (×9): 8.6 mg via ORAL
  Filled 2014-02-08 (×8): qty 1

## 2014-02-08 MED ORDER — MENTHOL 3 MG MT LOZG: 1.0000 | LOZENGE | OROMUCOSAL | Status: DC | PRN

## 2014-02-08 MED ORDER — ONDANSETRON HCL 4 MG PO TABS
4.0000 mg | ORAL_TABLET | Freq: Four times a day (QID) | ORAL | Status: DC | PRN
  Administered 2014-02-09: 4 mg via ORAL
  Filled 2014-02-08: qty 1

## 2014-02-08 MED ORDER — METOCLOPRAMIDE HCL 10 MG PO TABS: 5.0000 mg | ORAL_TABLET | Freq: Three times a day (TID) | ORAL | Status: DC | PRN

## 2014-02-08 SURGICAL SUPPLY — 66 items
BAG HAMPER (MISCELLANEOUS) ×4 IMPLANT
BIT DRILL 2.8X128 (BIT) ×3 IMPLANT
BIT DRILL 2.8X128MM (BIT) ×1
BLADE 10 SAFETY STRL DISP (BLADE) ×4 IMPLANT
BLADE HEX COATED 2.75 (ELECTRODE) ×4 IMPLANT
BLADE SAGITTAL 25.0X1.27X90 (BLADE) ×3 IMPLANT
BLADE SAGITTAL 25.0X1.27X90MM (BLADE) ×1
CAPT HIP FX BIPOLAR/UNIPOLAR ×4 IMPLANT
CHLORAPREP W/TINT 26ML (MISCELLANEOUS) ×4 IMPLANT
CLOTH BEACON ORANGE TIMEOUT ST (SAFETY) ×4 IMPLANT
COVER LIGHT HANDLE STERIS (MISCELLANEOUS) ×16 IMPLANT
COVER PROBE W GEL 5X96 (DRAPES) ×4 IMPLANT
DECANTER SPIKE VIAL GLASS SM (MISCELLANEOUS) ×8 IMPLANT
DRAPE BACK TABLE (DRAPES) ×4 IMPLANT
DRAPE HIP W/POCKET STRL (DRAPE) ×4 IMPLANT
DRAPE INCISE IOBAN 44X35 STRL (DRAPES) ×4 IMPLANT
DRAPE U-SHAPE 47X51 STRL (DRAPES) ×4 IMPLANT
DRSG MEPILEX BORDER 4X12 (GAUZE/BANDAGES/DRESSINGS) ×4 IMPLANT
ELECT REM PT RETURN 9FT ADLT (ELECTROSURGICAL) ×4
ELECTRODE REM PT RTRN 9FT ADLT (ELECTROSURGICAL) ×2 IMPLANT
FACESHIELD LNG OPTICON STERILE (SAFETY) ×4 IMPLANT
FACESHIELD OPICON STD (MASK) ×4 IMPLANT
GLOVE BIOGEL PI IND STRL 7.0 (GLOVE) ×4 IMPLANT
GLOVE BIOGEL PI INDICATOR 7.0 (GLOVE) ×4
GLOVE ECLIPSE 7.0 STRL STRAW (GLOVE) ×4 IMPLANT
GLOVE EXAM NITRILE LRG STRL (GLOVE) ×4 IMPLANT
GLOVE OPTIFIT SS 8.0 STRL (GLOVE) ×4 IMPLANT
GLOVE SKINSENSE NS SZ8.0 LF (GLOVE) ×4
GLOVE SKINSENSE STRL SZ8.0 LF (GLOVE) ×4 IMPLANT
GLOVE SS N UNI LF 6.5 STRL (GLOVE) ×4 IMPLANT
GLOVE SS N UNI LF 8.5 STRL (GLOVE) ×4 IMPLANT
GOWN STRL REUS W/TWL LRG LVL3 (GOWN DISPOSABLE) ×8 IMPLANT
GOWN STRL REUS W/TWL XL LVL3 (GOWN DISPOSABLE) ×4 IMPLANT
HANDPIECE INTERPULSE COAX TIP (DISPOSABLE) ×2
HOOD W/PEELAWAY (MISCELLANEOUS) ×12 IMPLANT
INST SET MAJOR BONE (KITS) ×4 IMPLANT
KIT BLADEGUARD II DBL (SET/KITS/TRAYS/PACK) ×4 IMPLANT
KIT ROOM TURNOVER APOR (KITS) ×4 IMPLANT
MANIFOLD NEPTUNE II (INSTRUMENTS) ×4 IMPLANT
MARKER SKIN DUAL TIP RULER LAB (MISCELLANEOUS) ×4 IMPLANT
NEEDLE HYPO 18GX1.5 BLUNT FILL (NEEDLE) ×4 IMPLANT
NEEDLE HYPO 21X1.5 SAFETY (NEEDLE) ×4 IMPLANT
NEEDLE HYPO 25X1 1.5 SAFETY (NEEDLE) ×4 IMPLANT
NS IRRIG 1000ML POUR BTL (IV SOLUTION) ×8 IMPLANT
PACK ARTHRO LIMB DRAPE STRL (MISCELLANEOUS) ×4 IMPLANT
PACK TOTAL JOINT (CUSTOM PROCEDURE TRAY) ×4 IMPLANT
PAD ARMBOARD 7.5X6 YLW CONV (MISCELLANEOUS) ×4 IMPLANT
PILLOW HIP ABDUCTION MED (ORTHOPEDIC SUPPLIES) ×4 IMPLANT
PIN STMN 9X.142 IN (PIN) ×8 IMPLANT
SET BASIN LINEN APH (SET/KITS/TRAYS/PACK) ×4 IMPLANT
SET HNDPC FAN SPRY TIP SCT (DISPOSABLE) ×2 IMPLANT
SPONGE LAP 18X18 X RAY DECT (DISPOSABLE) ×4 IMPLANT
STAPLER VISISTAT 35W (STAPLE) ×4 IMPLANT
SUT BRALON NAB BRD #1 30IN (SUTURE) ×12 IMPLANT
SUT ETHIBOND 5 LR DA (SUTURE) ×8 IMPLANT
SUT MNCRL 0 VIOLET CTX 36 (SUTURE) ×2 IMPLANT
SUT MON AB 2-0 CT1 36 (SUTURE) ×12 IMPLANT
SUT MONOCRYL 0 CTX 36 (SUTURE) ×2
SUT VIC AB 1 CT1 27 (SUTURE) ×6
SUT VIC AB 1 CT1 27XBRD ANTBC (SUTURE) ×6 IMPLANT
SYR 20CC LL (SYRINGE) ×4 IMPLANT
SYR 30ML LL (SYRINGE) ×4 IMPLANT
SYR BULB IRRIGATION 50ML (SYRINGE) ×4 IMPLANT
TOWEL OR 17X26 4PK STRL BLUE (TOWEL DISPOSABLE) ×4 IMPLANT
WATER STERILE IRR 1000ML POUR (IV SOLUTION) ×8 IMPLANT
YANKAUER SUCT 12FT TUBE ARGYLE (SUCTIONS) ×4 IMPLANT

## 2014-02-08 NOTE — Progress Notes (Signed)
Patient received in ICU at 1500, s/p right hip repair, d/t hypoxia. Giving  patient  frequent encouragement to take deep breaths, and use incentive spirometer. Patient breathing shallow, c/o rib pain s/p fall at home. Patient medicated for pain as per orders. Teaching given to patient and family prevention of pneumonia, they were able to "teach back" information.

## 2014-02-08 NOTE — Interval H&P Note (Signed)
History and Physical Interval Note:  02/08/2014 9:47 AM  Elizabeth [name]  has presented today for surgery, with the diagnosis of Right femoral neck fracture  The various methods of treatment have been discussed with the patient and family. After consideration of risks, benefits and other options for treatment, the patient has consented to  Procedure(s) with comments: [place] (Right) - Possible total hip depending on the acetabular cartilage inspection at time of surgery POSSIBLE TOTAL HIP ARTHROPLASTY (Right) as a surgical intervention .  The patient's history has been reviewed, patient examined, no change in status, stable for surgery.  I have reviewed the patient's chart and labs.  Questions were answered to the patient's satisfaction.     Arther Abbott

## 2014-02-08 NOTE — Clinical Social Work Note (Signed)
CSW spoke with pt's granddaughter Elizabeth [name] who was present during initial assessment. Pt has bed at [facility] which was her preference. Elizabeth [name] reports family will discuss further this weekend with pt and see how she does with therapy. CSW will follow up.  Benay Pike, [place]

## 2014-02-08 NOTE — Anesthesia Preprocedure Evaluation (Addendum)
Anesthesia Evaluation  Patient identified by MRN, date of birth, ID band Patient awake    Reviewed: Allergy & Precautions, H&P , NPO status , Patient's Chart, lab work & pertinent test results  Airway Mallampati: III TM Distance: <3 FB Neck ROM: Limited    Dental  (+) Teeth Intact   Pulmonary  RA sat= 68%, 2L [place] up to 88-90%.  CXR=NAD, possible pulm htn breath sounds clear to auscultation        Cardiovascular hypertension, Pt. on medications Rhythm:Regular Rate:Normal     Neuro/Psych PSYCHIATRIC DISORDERS Anxiety bilat sciatica, occasional leg numbness.    GI/Hepatic negative GI ROS,   Endo/Other  diabetes, Well Controlled, Type 2, Oral Hypoglycemic AgentsHypothyroidism   Renal/GU      Musculoskeletal  (+) Arthritis -,   Abdominal   Peds  Hematology   Anesthesia Other Findings Denies LOC.  Reproductive/Obstetrics                          Anesthesia Physical Anesthesia Plan  ASA: III  Anesthesia Plan: Spinal   Post-op Pain Management:    Induction: Intravenous  Airway Management Planned: Simple Face Mask  Additional Equipment:   Intra-op Plan:   Post-operative Plan:   Informed Consent: I have reviewed the patients History and Physical, chart, labs and discussed the procedure including the risks, benefits and alternatives for the proposed anesthesia with the patient or authorized representative who has indicated his/her understanding and acceptance.     Plan Discussed with:   Anesthesia Plan Comments: (Started incentive spirometry. Discussed preference for regional anesthesia with daughter present.)        Anesthesia Quick Evaluation

## 2014-02-08 NOTE — H&P (View-Only) (Signed)
Reason for Consult: Right hip fracture  Referring Physician:   Mercadies Co is an 80 y.o. female.  HPI: 80 year-old female with history of spinal stenosis scheduled to see Dr. Carloyn Manner tomorrow fell at a local [employer] store when her legs gave way. She's had frequent giving out symptoms of the lower extremity secondary to spinal stenosis. She's had physical therapy and epidural steroid injections with no relief. She did use a cane on occasion but did not have it when she fell. She has fractured her right hip. She complains of nonradiating, dull aching excruciating pain in the right hip and cannot ambulate. Her leg is shortened and externally rotated.  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 We will get hematology involved to determine if another vaccine as needed and also to make recommendations for postop DVT prevention.  Past Medical History  Diagnosis Date  . Arthritis   . Anxiety   . Thyroid disease   . Hypertension   . Diabetes mellitus without complication   . Cancer     ovarian    Past Surgical History  Procedure Laterality Date  . Splenectomy, total    . Abdominal hysterectomy    . Appendectomy    . Hand surgery    . Foot surgery    . Back surgery      History reviewed. No pertinent family history.  Social History:  reports that she has never smoked. She does not have any smokeless tobacco history on file. She reports that she does not drink alcohol or use illicit drugs.  Allergies:  Allergies  Allergen Reactions  . Ivp Dye [Iodinated Diagnostic Agents] Hives  . Penicillins Hives  . Sulfa Antibiotics Nausea And Vomiting    Medications: I have reviewed the patient's current medications.  Results for orders placed during the hospital encounter of 02/06/14 (from the past 48 hour(s))  CBC WITH DIFFERENTIAL     Status: Abnormal   Collection Time    02/06/14  2:11 PM      Result Value Ref Range   WBC 14.1 (*) 4.0 - 10.5 K/uL   RBC 4.71   3.87 - 5.11 MIL/uL   Hemoglobin 14.1  12.0 - 15.0 g/dL   HCT 41.2  36.0 - 46.0 %   MCV 87.5  78.0 - 100.0 fL   MCH 29.9  26.0 - 34.0 pg   MCHC 34.2  30.0 - 36.0 g/dL   RDW 14.2  11.5 - 15.5 %   Platelets 274  150 - 400 K/uL   Neutrophils Relative % 60  43 - 77 %   Lymphocytes Relative 30  12 - 46 %   Monocytes Relative 9  3 - 12 %   Eosinophils Relative 1  0 - 5 %   Basophils Relative 0  0 - 1 %   Neutro Abs 8.5 (*) 1.7 - 7.7 K/uL   Lymphs Abs 4.2 (*) 0.7 - 4.0 K/uL   Monocytes Absolute 1.3 (*) 0.1 - 1.0 K/uL   Eosinophils Absolute 0.1  0.0 - 0.7 K/uL   Basophils Absolute 0.0  0.0 - 0.1 K/uL   WBC Morphology INCREASED BANDS (>20% BANDS)     Smear Review LARGE PLATELETS PRESENT     Comment: GIANT PLATELETS SEEN  COMPREHENSIVE METABOLIC PANEL     Status: Abnormal   Collection Time    02/06/14  2:11 PM      Result Value Ref Range   Sodium 141  137 - 147 mEq/L   Potassium 3.9  3.7 - 5.3 mEq/L   Chloride 100  96 - 112 mEq/L   CO2 24  19 - 32 mEq/L   Glucose, Bld 136 (*) 70 - 99 mg/dL   BUN 15  6 - 23 mg/dL   Creatinine, Ser 0.69  0.50 - 1.10 mg/dL   Calcium 9.9  8.4 - 10.5 mg/dL   Total Protein 7.7  6.0 - 8.3 g/dL   Albumin 4.4  3.5 - 5.2 g/dL   AST 35  0 - 37 U/L   ALT 28  0 - 35 U/L   Alkaline Phosphatase 56  39 - 117 U/L   Total Bilirubin 0.4  0.3 - 1.2 mg/dL   GFR calc non Af Amer 81 (*) >90 mL/min   GFR calc Af Amer >90  >90 mL/min   Comment: (NOTE)     The eGFR has been calculated using the CKD EPI equation.     This calculation has not been validated in all clinical situations.     eGFR's persistently <90 mL/min signify possible Chronic Kidney     Disease.   Anion gap 17 (*) 5 - 15  URINALYSIS, ROUTINE W REFLEX MICROSCOPIC     Status: Abnormal   Collection Time    02/06/14  4:41 PM      Result Value Ref Range   Color, Urine YELLOW  YELLOW   APPearance CLEAR  CLEAR   Specific Gravity, Urine 1.015  1.005 - 1.030   pH 5.5  5.0 - 8.0   Glucose, UA NEGATIVE   NEGATIVE mg/dL   Hgb urine dipstick NEGATIVE  NEGATIVE   Bilirubin Urine NEGATIVE  NEGATIVE   Ketones, ur TRACE (*) NEGATIVE mg/dL   Protein, ur NEGATIVE  NEGATIVE mg/dL   Urobilinogen, UA 0.2  0.0 - 1.0 mg/dL   Nitrite NEGATIVE  NEGATIVE   Leukocytes, UA NEGATIVE  NEGATIVE   Comment: MICROSCOPIC NOT DONE ON URINES WITH NEGATIVE PROTEIN, BLOOD, LEUKOCYTES, NITRITE, OR GLUCOSE <1000 mg/dL.  GLUCOSE, CAPILLARY     Status: Abnormal   Collection Time    02/06/14  8:22 PM      Result Value Ref Range   Glucose-Capillary 131 (*) 70 - 99 mg/dL   Comment 1 Documented in Chart     Comment 2 Notify RN    COMPREHENSIVE METABOLIC PANEL     Status: Abnormal   Collection Time    02/07/14  5:13 AM      Result Value Ref Range   Sodium 141  137 - 147 mEq/L   Potassium 3.9  3.7 - 5.3 mEq/L   Chloride 102  96 - 112 mEq/L   CO2 27  19 - 32 mEq/L   Glucose, Bld 160 (*) 70 - 99 mg/dL   BUN 10  6 - 23 mg/dL   Creatinine, Ser 0.64  0.50 - 1.10 mg/dL   Calcium 9.2  8.4 - 10.5 mg/dL   Total Protein 7.0  6.0 - 8.3 g/dL   Albumin 3.8  3.5 - 5.2 g/dL   AST 26  0 - 37 U/L   ALT 24  0 - 35 U/L   Alkaline Phosphatase 52  39 - 117 U/L   Total Bilirubin 0.5  0.3 - 1.2 mg/dL   GFR calc non Af Amer 83 (*) >90 mL/min   GFR calc Af Amer >90  >90 mL/min   Comment: (NOTE)     The eGFR has been calculated using  the CKD EPI equation.     This calculation has not been validated in all clinical situations.     eGFR's persistently <90 mL/min signify possible Chronic Kidney     Disease.   Anion gap 12  5 - 15  CBC     Status: Abnormal   Collection Time    02/07/14  5:13 AM      Result Value Ref Range   WBC 11.1 (*) 4.0 - 10.5 K/uL   RBC 4.29  3.87 - 5.11 MIL/uL   Hemoglobin 12.9  12.0 - 15.0 g/dL   HCT 37.9  36.0 - 46.0 %   MCV 88.3  78.0 - 100.0 fL   MCH 30.1  26.0 - 34.0 pg   MCHC 34.0  30.0 - 36.0 g/dL   RDW 14.4  11.5 - 15.5 %   Platelets 276  150 - 400 K/uL    Dg Chest 1 View  02/06/2014   CLINICAL  DATA:  Fall, pain  EXAM: CHEST - 1 VIEW  COMPARISON:  None.  FINDINGS: The aorta is unfolded and ectatic. Moderate enlargement of the cardiac silhouette is noted. Prominence of the central pulmonary arteries with rapid tapering may suggest pulmonary arterial hypertension. No pleural effusion. No acute osseous finding.  IMPRESSION: Cardiomegaly with possible pulmonary arterial hypertension but no focal acute finding allowing for one view technique.   Electronically Signed   By: Conchita Paris M.D.   On: 02/06/2014 15:20   Dg Hip Complete Right  02/06/2014   CLINICAL DATA:  Fall  EXAM: RIGHT HIP - COMPLETE 2+ VIEW  COMPARISON:  None.  FINDINGS: There is a displaced fracture through the right femoral neck. The femoral head appears appropriately located. Lower lumbar spine degenerative changes. SI joints unremarkable. Pelvic phleboliths.  IMPRESSION: Displaced right femoral neck fracture.   Electronically Signed   By: Lovey Newcomer M.D.   On: 02/06/2014 15:21   Ct Head Wo Contrast  02/06/2014   CLINICAL DATA:  Status post fall with hematoma over the right eyebrow  EXAM: CT HEAD WITHOUT CONTRAST  CT CERVICAL SPINE WITHOUT CONTRAST  TECHNIQUE: Multidetector CT imaging of the head and cervical spine was performed following the standard protocol without intravenous contrast. Multiplanar CT image reconstructions of the cervical spine were also generated.  COMPARISON:  None.  FINDINGS: CT HEAD FINDINGS  The ventricles are normal in size and position. There is no intracranial hemorrhage nor intracranial mass effect. There is no acute ischemic change. The cerebellum and brainstem are normal. The globes and other orbital soft tissues are normal with the exception of preseptal edema on the right. At bone window settings the observed paranasal sinuses and mastoid air cells are clear. There is a large cephalohematoma over the right forehead. There is no acute skull fracture.  CT CERVICAL SPINE FINDINGS  The cervical vertebral  bodies are preserved in height. There is mild disc space narrowing at C3-4, C4-5, and C6-7. The prevertebral soft tissue spaces are normal. There is no perched facet nor facet or spinous process fracture. The odontoid is intact.  IMPRESSION: 1. There is a large right-sided cephalohematoma. There is no acute intracranial hemorrhage nor other acute intracranial abnormality. There is no skull fracture. 2. There is no acute cervical spine fracture nor dislocation. There is degenerative disc change at multiple levels.   Electronically Signed   By: David  Martinique   On: 02/06/2014 15:43   Ct Cervical Spine Wo Contrast  02/06/2014   CLINICAL DATA:  Status  post fall with hematoma over the right eyebrow  EXAM: CT HEAD WITHOUT CONTRAST  CT CERVICAL SPINE WITHOUT CONTRAST  TECHNIQUE: Multidetector CT imaging of the head and cervical spine was performed following the standard protocol without intravenous contrast. Multiplanar CT image reconstructions of the cervical spine were also generated.  COMPARISON:  None.  FINDINGS: CT HEAD FINDINGS  The ventricles are normal in size and position. There is no intracranial hemorrhage nor intracranial mass effect. There is no acute ischemic change. The cerebellum and brainstem are normal. The globes and other orbital soft tissues are normal with the exception of preseptal edema on the right. At bone window settings the observed paranasal sinuses and mastoid air cells are clear. There is a large cephalohematoma over the right forehead. There is no acute skull fracture.  CT CERVICAL SPINE FINDINGS  The cervical vertebral bodies are preserved in height. There is mild disc space narrowing at C3-4, C4-5, and C6-7. The prevertebral soft tissue spaces are normal. There is no perched facet nor facet or spinous process fracture. The odontoid is intact.  IMPRESSION: 1. There is a large right-sided cephalohematoma. There is no acute intracranial hemorrhage nor other acute intracranial abnormality.  There is no skull fracture. 2. There is no acute cervical spine fracture nor dislocation. There is degenerative disc change at multiple levels.   Electronically Signed   By: David  Martinique   On: 02/06/2014 15:43    ROS Blood pressure 127/60, pulse 72, temperature 98 F (36.7 C), temperature source Oral, resp. rate 20, height 5' 2"  (1.575 m), weight 161 lb 6 oz (73.2 kg), SpO2 97.00%. Physical Exam  THE PAST FAMILY, MEDICAL, SURGICAL AND SOCIAL HISTORY HAS BEEN REVIEWED AND RECORDED IN PROVIDED SECTIONS OF EPIC.  PHYSICAL EXAM  The patient is a large bruise and hematoma over the right eye socket with no visual loss. She has no other head trauma. Neck is supple nontender. Upper extremities show no fracture subluxation atrophy tremor or malalignment. Muscle tone is normal. Well-developed well-nourished no congenital deformities.  She is awake alert and oriented x3 mood and affect is normal.  Ambulation confined to bed secondary to right hip fracture  Has good distal pulses normal perfusion are feet are warm to touch has good radial and ulnar pulses bilaterally. Dorsalis pedis pulses are intact as well. She is no sensory loss in her feet or hands. She is no lymphadenopathy. Skin is warm dry and intact without rash lesion or ulceration  Her left leg is in normal alignment, muscle tone and strength are normal. Hip knee and ankle are reduced and stable. Motor grade 5.  Her right leg is in external rotation is tenderness and swelling over the proximal thigh and hip. Range of motion and stability tests were deferred, knee and ankle are reduced and stable. Muscle tone is normal   Assessment/Plan: Right femoral neck fracture  Recommend right bipolar hip replacement Consults hematology  Arther Abbott 02/07/2014, 7:52 AM

## 2014-02-08 NOTE — Anesthesia Procedure Notes (Addendum)
Date/Time: 02/08/2014 10:20 AM Performed by: Andree Elk, AMY A Pre-anesthesia Checklist: Patient identified, Timeout performed, Emergency Drugs available, Suction available and Patient being monitored Oxygen Delivery Method: Simple face mask   Spinal  Patient location during procedure: OR Start time: 02/08/2014 10:52 AM Staffing Anesthesiologist: Lerry Liner CRNA/Resident: ADAMS, AMY A Preanesthetic Checklist Completed: patient identified, site marked, surgical consent, pre-op evaluation, timeout performed, IV checked, risks and benefits discussed and monitors and equipment checked Spinal Block Patient position: right lateral decubitus Prep: Betadine Patient monitoring: heart rate, cardiac monitor, continuous pulse ox and blood pressure Approach: right paramedian Location: L3-4 Injection technique: single-shot Needle Needle type: Spinocan  Needle gauge: 22 G Needle length: 9 cm Assessment Sensory level: T8 Additional Notes ATTEMPTS:2 TRAY HC:[id number] TRAY EXPIRATION DATE:01/2015

## 2014-02-08 NOTE — Op Note (Signed)
02/08/2014  1:03 PM  PATIENT:  Elizabeth [name]  80 y.o. female  PRE-OPERATIVE DIAGNOSIS:  Right femoral neck fracture  POST-OPERATIVE DIAGNOSIS:  Right femoral neck fracture  FINDINGS: IMPACTED RIGHT FEMORAL NECK FRACTURE   6 PROSTHESIS F  45 HEAD 1.5 INNER HEAD   PROCEDURE:  Procedure(s): ARTHROPLASTY BIPOLAR HIP (Right)  SURGEON:  Surgeon(s) and Role:    * Carole Civil, MD - Primary  PHYSICIAN ASSISTANT:   ASSISTANTS: BETTY ASHLEY    ANESTHESIA:   spinal  EBL:  Total I/O In: 1400 [I.V.:1400] Out: 750 [Urine:450; Blood:300]  BLOOD ADMINISTERED:250 CC PRBC  DRAINS: none   LOCAL MEDICATIONS USED:  MARCAINE     SPECIMEN:  No Specimen  DISPOSITION OF SPECIMEN:  N/A  COUNTS:  YES  TOURNIQUET:  * No tourniquets in log *  DICTATION: .Dragon Dictation  PLAN OF CARE: Admit to inpatient   PATIENT DISPOSITION:  PACU - hemodynamically stable.   Delay start of Pharmacological VTE agent (>24hrs) due to surgical blood loss or risk of bleeding: yes   Procedure the patient was identified in preoperative holding area back to prove identification markers. She was evaluated for surgical intervention. Right leg was checked, beginning acceptable for surgery and was marked as such. Chart review was completed.  The patient was taken to the operating room for spinal anesthetic. Clindamycin secondary to penicillin allergy. He used 900 mg IV. She was placed in lateral decubitus position right side up. After sterile prep and drape timeout was executed and completed.  Direct lateral approach to the hip was performed subcutaneous tissue was divided. The incision was carried down to the fascia. The fascia was split in line with the skin incision. It was noted at this time that the subcutaneous fat and a significant amount of hemorrhage.  The fascia was split in line with the skin incision in the abductor musculature was identified. The anterior half was peeled from the greater  trochanter in continuity with the vastus lateralis. He abductor gluteus medius and minimus were both retracted proximally and it was dissected from the capsule. This was held with contractures. A capsulectomy was performed. There is an extensive amount of bleeding noted.  This was controlled with electrocautery as it was encountered.  A capsulectomy was performed and the hip was dislocated anteriorly. The head was removed and measured 45 mm in diameter  The acetabulum was checked and found to be devoid of any cartilaginous arthritic lesions  After thorough irrigation the hip was dislocated anteriorly and the proximal femur was prepared for prosthetic insertion.  We first started with a starter entry hole reamer followed by a canal finder trochanteric reamer and box osteotome.  We broached up to a size 6. We sized the femoral head to a 45 with a 1.5 neck length and did a trial reduction.  Upon reduction we found to be stable in all planes including sleeping position. Leg lengths were also equal.  Note significant bleeding from the femoral canal. This was treated with suction irrigation and epinephrine-soaked gauze sponge.  Drill holes were placed in the proximal femur and #5 sutures were passed through the drill holes.   The prosthesis was inserted. The head and neck were placed and checked for tightness. The hip was reduced and taken through range of motion found to be stable.  The abductor mechanism and vastus lateralis sleeve was repaired back to its anatomic position with the leg internally rotated. This was oversewn with a #1 Bralon suture  We  injected the hip with Marcaine and epinephrine  Close the fascia with #1 Bralon in running fashion. We closed subcutaneous tissue with 0 Monocryl in one layer followed by 2-0 Monocryl and a second layer and the skin was then reapproximated with staples  The patient was placed on a regular bed in. Final leg length checked on the leg lengths  equal. Hip rotation and right rotation were stabilized back to normal.  Patient was taken recovery room in stable condition

## 2014-02-08 NOTE — Anesthesia Postprocedure Evaluation (Signed)
  Anesthesia Post-op Note  Patient: Elizabeth [name]  Procedure(s) Performed: Procedure(s): ARTHROPLASTY BIPOLAR HIP (Right)  Patient Location: PACU  Anesthesia Type:Spinal  Level of Consciousness: awake, alert , oriented and patient cooperative  Airway and Oxygen Therapy: Patient Spontanous Breathing and Patient connected to face mask oxygen  Post-op Pain: none  Post-op Assessment: Post-op Vital signs reviewed, Patient's Cardiovascular Status Stable, Respiratory Function Stable, Patent Airway, No signs of Nausea or vomiting and Pain level controlled  Post-op Vital Signs: Reviewed and stable  Last Vitals:  Filed Vitals:   02/08/14 1015  BP: 120/65  Pulse:   Temp:   Resp: 17    Complications: No apparent anesthesia complications

## 2014-02-08 NOTE — Progress Notes (Signed)
Dr Patsey Berthold made aware of oxygenation.  Verbal order taken for I.S. Teaching completed with daughter and patient.  Both demonstrate understanding and O2 sat 93% after use.  Applied oxygen via mask, per Dr Patsey Berthold request.

## 2014-02-08 NOTE — Brief Op Note (Addendum)
02/06/2014 - 02/08/2014  1:03 PM  PATIENT:  Elizabeth [name]  80 y.o. female  PRE-OPERATIVE DIAGNOSIS:  Right femoral neck fracture  POST-OPERATIVE DIAGNOSIS:  Right femoral neck fracture  FINDINGS: IMPACTED RIGHT FEMORAL NECK FRACTURE   6 PROSTHESIS F  45 HEAD 1.5 INNER HEAD   PROCEDURE:  Procedure(s): ARTHROPLASTY BIPOLAR HIP (Right)  SURGEON:  Surgeon(s) and Role:    * Carole Civil, MD - Primary  PHYSICIAN ASSISTANT:   ASSISTANTS: BETTY ASHLEY    ANESTHESIA:   spinal  EBL:  Total I/O In: 1400 [I.V.:1400] Out: 750 [Urine:450; Blood:300]  BLOOD ADMINISTERED:250 CC PRBC  DRAINS: none   LOCAL MEDICATIONS USED:  MARCAINE     SPECIMEN:  No Specimen  DISPOSITION OF SPECIMEN:  N/A  COUNTS:  YES  TOURNIQUET:  * No tourniquets in log *  DICTATION: .Dragon Dictation  PLAN OF CARE: Admit to inpatient   PATIENT DISPOSITION:  PACU - hemodynamically stable.   Delay start of Pharmacological VTE agent (>24hrs) due to surgical blood loss or risk of bleeding: yes

## 2014-02-08 NOTE — Progress Notes (Signed)
[facility] PROGRESS NOTE  Elizabeth [name] SAY:[id number] DOB: [birth year] DOA: 02/06/2014 PCP: No primary provider on file.  Assessment/Plan  Code Status: Full code Family Communication: Discussed with her daughter and granddaughter Disposition Plan: Likely discharge to skilled nursing facility when medically appropriate.   Consultants:  Orthopedic, Dr. Aline Brochure  Procedures:  Right hip arthroplasty, 02/08/14.  Antibiotics:  Preop clindamycin 02/08/14.  HPI/Subjective: The patient is several hours post operative from the right hip surgery. She is laying in bed. Her family is in the room and questions answered. She complains of right hip pain. She denies chest pain, pleurisy, or shortness of breath at rest.  Objective: Filed Vitals:   02/08/14 1600  BP: 127/51  Pulse: 87  Temp:   Resp: 17   temperature 97.7. Oxygen saturation 95% on 40% oxygen. 98% on the current monitor.  Intake/Output Summary (Last 24 hours) at 02/08/14 1606 Last data filed at 02/08/14 1238  Gross per 24 hour  Intake   2770 ml  Output   1550 ml  Net   1220 ml   Filed Weights   02/06/14 1357 02/06/14 1924  Weight: 71.668 kg (158 lb) 73.2 kg (161 lb 6 oz)    Exam:   General:  80 year old Caucasian woman laying in bed, in no acute distress.  Face/head: Large scalp hematoma on the right for head with periorbital ecchymosis, mildly tender. Some decrease in the amount of ecchymosis and edema. Pupils equal, round, and reactive to light. Extraocular movements are intact.  Cardiovascular: S1, S2, with a soft systolic murmur.  Respiratory: Decreased breath sounds in the bases, clear anteriorly.  Abdomen: Positive bowel sounds, soft, nontender, nondistended.  Musculoskeletal: Right hip bandage in place, not taken off. Trace of pedal edema on the right. Left lower extremity unremarkable.  Neurologic: She is alert and oriented x3. Cranial nerves II through XII are intact. Vision and extraocular  movements are grossly intact.   Data Reviewed: Basic Metabolic Panel:  Recent Labs Lab 02/06/14 1411 02/07/14 0513  NA 141 141  K 3.9 3.9  CL 100 102  CO2 24 27  GLUCOSE 136* 160*  BUN 15 10  CREATININE 0.69 0.64  CALCIUM 9.9 9.2   Liver Function Tests:  Recent Labs Lab 02/06/14 1411 02/07/14 0513  AST 35 26  ALT 28 24  ALKPHOS 56 52  BILITOT 0.4 0.5  PROT 7.7 7.0  ALBUMIN 4.4 3.8   No results found for this basename: LIPASE, AMYLASE,  in the last 168 hours No results found for this basename: AMMONIA,  in the last 168 hours CBC:  Recent Labs Lab 02/06/14 1411 02/07/14 0513 02/08/14 0512 02/08/14 1335  WBC 14.1* 11.1* 18.5* 19.5*  NEUTROABS 8.5*  --   --   --   HGB 14.1 12.9 13.0 12.0  HCT 41.2 37.9 38.7 35.3*  MCV 87.5 88.3 90.4 89.6  PLT 274 276 290 243   Cardiac Enzymes: No results found for this basename: CKTOTAL, CKMB, CKMBINDEX, TROPONINI,  in the last 168 hours BNP (last 3 results) No results found for this basename: PROBNP,  in the last 8760 hours CBG:  Recent Labs Lab 02/07/14 1638 02/07/14 1943 02/08/14 0744 02/08/14 0837 02/08/14 1305  GLUCAP 139* 177* 199* 175* 151*    Recent Results (from the past 240 hour(s))  MRSA PCR SCREENING     Status: None   Collection Time    02/07/14  2:15 PM      Result Value Ref Range Status   MRSA by  PCR NEGATIVE  NEGATIVE Final   Comment:            The GeneXpert MRSA Assay (FDA     approved for NASAL specimens     only), is one component of a     comprehensive MRSA colonization     surveillance program. It is not     intended to diagnose MRSA     infection nor to guide or     monitor treatment for     MRSA infections.     Studies: Dg Pelvis Portable  02/08/2014   CLINICAL DATA:  Status post hip replacement  EXAM: PORTABLE PELVIS 1-2 VIEWS  COMPARISON:  None.  FINDINGS: Right hip arthroplasty is noted.  No acute abnormality is seen.   Electronically Signed   By: Inez Catalina M.D.   On:  02/08/2014 13:35    Scheduled Meds: . amLODipine  5 mg Oral Daily  . atorvastatin  20 mg Oral q1800  . clindamycin (CLEOCIN) IV  600 mg Intravenous Q6H  . docusate sodium  100 mg Oral BID  . irbesartan  150 mg Oral Daily  . levothyroxine  125 mcg Oral QAC breakfast  . metFORMIN  1,000 mg Oral BID WC  . pantoprazole  40 mg Oral Daily  . senna  1 tablet Oral BID   Continuous Infusions: . sodium chloride 100 mL/hr at 02/08/14 1532   Assessment and plan: Principal Problem:   Fracture of femoral neck, right Active Problems:   Traumatic hematoma of head   HTN (hypertension)   Type 2 diabetes mellitus without complication   Hypothyroidism   Spinal stenosis   Hip fracture   Hypoxia   1. Acute right femoral neck fracture/hip fracture secondary to fall. Status post right hip arthroplasty on 9/18, by Dr. Aline Brochure. We'll continue supportive treatment and pain management.  Perioperative bleeding. Per Dr. Aline Brochure, the patient had an appreciable amount of bleeding from the right femur during the operation. She was transfused one unit of packed red blood cells. Currently, her hemoglobin is stable. We'll continue to monitor her hemoglobin/hematocrit.  Large right-sided cephalohematoma, secondary to fall. CT of her head revealed no intracranial hemorrhage or other acute intracranial abnormality. Neurologically, she is intact. We will continue to monitor her and provide supportive treatment.  Transient preoperative hypoxia. Apparently, in OR, her oxygen saturation fell to the 60s. She was placed on oxygen which improved her oxygen saturations to 100%. Etiology unclear, but it appears to have been transient. Wean oxygen accordingly. We'll order a followup chest x-ray if needed. Her lungs are clear on exam with decreased breath sounds in the bases. We'll order incentive spirometry.  Leukocytosis. 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  She is afebrile and has no sign of UTI or pneumonia on admission. Hematology was consulted to give recommendations on DVT prophylaxis in a patient with a history of ITP and splenectomy. Dr. Bishop Limbo via PA Mr. Sheldon Silvan recommends typical DVT prophylaxis.  History of spinal stenosis. She was scheduled to see neurosurgeon, Dr. Carloyn Manner.  Hypertension. Currently stable on ARB and amlodipine.  Diabetes mellitus, type II. We'll continue metformin. We'll monitor her CBGs before each meal.  Hypothyroidism. We'll continue Synthroid. TSH is pending.       Time spent: 35 minutes.    [place] Pager [phone]. If 7PM-7AM, please contact night-coverage at [email or web address], password [facility] 02/08/2014, 4:06 PM  LOS: 2 days

## 2014-02-08 NOTE — Transfer of Care (Signed)
Immediate Anesthesia Transfer of Care Note  Patient: Elizabeth [name]  Procedure(s) Performed: Procedure(s): ARTHROPLASTY BIPOLAR HIP (Right)  Patient Location: PACU  Anesthesia Type:Spinal  Level of Consciousness: awake, alert , oriented and patient cooperative  Airway & Oxygen Therapy: Patient Spontanous Breathing and Patient connected to face mask oxygen  Post-op Assessment: Report given to PACU RN and Post -op Vital signs reviewed and stable  Post vital signs: Reviewed and stable  Complications: No apparent anesthesia complications

## 2014-02-09 ENCOUNTER — Inpatient Hospital Stay (HOSPITAL_COMMUNITY): Payer: Medicare Other

## 2014-02-09 DIAGNOSIS — D62 Acute posthemorrhagic anemia: Secondary | ICD-10-CM | POA: Diagnosis not present

## 2014-02-09 DIAGNOSIS — M48061 Spinal stenosis, lumbar region without neurogenic claudication: Secondary | ICD-10-CM

## 2014-02-09 DIAGNOSIS — R946 Abnormal results of thyroid function studies: Secondary | ICD-10-CM | POA: Insufficient documentation

## 2014-02-09 LAB — BASIC METABOLIC PANEL
Anion gap: 10 (ref 5–15)
BUN: 10 mg/dL (ref 6–23)
CALCIUM: 8.2 mg/dL — AB (ref 8.4–10.5)
CO2: 26 mEq/L (ref 19–32)
Chloride: 100 mEq/L (ref 96–112)
Creatinine, Ser: 0.59 mg/dL (ref 0.50–1.10)
GFR calc Af Amer: >90 mL/min (ref >90)
GFR calc non Af Amer: 86 mL/min — ABNORMAL LOW (ref >90)
GLUCOSE: 193 mg/dL — AB (ref 70–99)
POTASSIUM: 4.1 meq/L (ref 3.7–5.3)
Sodium: 136 mEq/L — ABNORMAL LOW (ref 137–147)

## 2014-02-09 LAB — HEMOGLOBIN A1C
Hgb A1c MFr Bld: 6.9 % — ABNORMAL HIGH (ref <5.7)
Mean Plasma Glucose: 151 mg/dL — ABNORMAL HIGH (ref <117)

## 2014-02-09 LAB — URINE MICROSCOPIC-ADD ON

## 2014-02-09 LAB — GLUCOSE, CAPILLARY
GLUCOSE-CAPILLARY: 305 mg/dL — AB (ref 70–99)
GLUCOSE-CAPILLARY: 238 mg/dL — AB (ref 70–99)
Glucose-Capillary: 170 mg/dL — ABNORMAL HIGH (ref 70–99)
Glucose-Capillary: 185 mg/dL — ABNORMAL HIGH (ref 70–99)

## 2014-02-09 LAB — CBC
HCT: 33.8 % — ABNORMAL LOW (ref 36.0–46.0)
Hemoglobin: 11.3 g/dL — ABNORMAL LOW (ref 12.0–15.0)
MCH: 29.7 pg (ref 26.0–34.0)
MCHC: 33.4 g/dL (ref 30.0–36.0)
MCV: 88.9 fL (ref 78.0–100.0)
Platelets: 243 10*3/uL (ref 150–400)
RBC: 3.8 MIL/uL — ABNORMAL LOW (ref 3.87–5.11)
RDW: 14.7 % (ref 11.5–15.5)
WBC: 19.4 10*3/uL — ABNORMAL HIGH (ref 4.0–10.5)

## 2014-02-09 LAB — URINALYSIS, ROUTINE W REFLEX MICROSCOPIC
Bilirubin Urine: NEGATIVE
GLUCOSE, UA: 100 mg/dL — AB
KETONES UR: NEGATIVE mg/dL
Leukocytes, UA: NEGATIVE
Nitrite: NEGATIVE
PROTEIN: NEGATIVE mg/dL
Specific Gravity, Urine: 1.02 (ref 1.005–1.030)
UROBILINOGEN UA: 0.2 mg/dL (ref 0.0–1.0)
pH: 6 (ref 5.0–8.0)

## 2014-02-09 MED ORDER — METHYLPREDNISOLONE SODIUM SUCC 40 MG IJ SOLR
40.0000 mg | Freq: Four times a day (QID) | INTRAMUSCULAR | Status: DC
  Administered 2014-02-09 – 2014-02-10 (×4): 40 mg via INTRAVENOUS
  Filled 2014-02-09 (×4): qty 1

## 2014-02-09 MED ORDER — GABAPENTIN 100 MG PO CAPS
100.0000 mg | ORAL_CAPSULE | Freq: Every day | ORAL | Status: DC
  Administered 2014-02-09: 100 mg via ORAL
  Filled 2014-02-09: qty 1

## 2014-02-09 MED ORDER — INSULIN ASPART 100 UNIT/ML ~~LOC~~ SOLN
0.0000 [IU] | Freq: Three times a day (TID) | SUBCUTANEOUS | Status: DC
  Administered 2014-02-09: 7 [IU] via SUBCUTANEOUS
  Administered 2014-02-09 – 2014-02-10 (×2): 2 [IU] via SUBCUTANEOUS

## 2014-02-09 MED ORDER — HYDROCODONE-ACETAMINOPHEN 10-325 MG PO TABS
1.0000 | ORAL_TABLET | ORAL | Status: DC
  Administered 2014-02-09 – 2014-02-11 (×12): 1 via ORAL
  Filled 2014-02-09 (×12): qty 1

## 2014-02-09 MED ORDER — SERTRALINE HCL 50 MG PO TABS
100.0000 mg | ORAL_TABLET | Freq: Every day | ORAL | Status: DC
  Administered 2014-02-09 – 2014-02-12 (×4): 100 mg via ORAL
  Filled 2014-02-09 (×4): qty 2

## 2014-02-09 MED ORDER — ENOXAPARIN SODIUM 30 MG/0.3ML ~~LOC~~ SOLN
30.0000 mg | SUBCUTANEOUS | Status: DC
  Administered 2014-02-10 – 2014-02-12 (×3): 30 mg via SUBCUTANEOUS
  Filled 2014-02-09 (×3): qty 0.3

## 2014-02-09 NOTE — Progress Notes (Signed)
[facility] PROGRESS NOTE  Carolyna Yerian WSF:[id number] DOB: 1933/06/19 DOA: 02/06/2014 PCP: No primary provider on file.  Assessment/Plan  Code Status: Full code Family Communication: Discussed with her granddaughter Disposition Plan: Likely discharge to skilled nursing facility when medically appropriate.   Consultants:  Orthopedic, Dr. Aline Brochure  Procedures:  Right hip arthroplasty, 02/08/14.  Antibiotics:  Preop clindamycin 02/08/14.  HPI/Subjective: The patient complains of moderate pain in her right hip and right leg; pain in her left leg and lower back. She has chronic pain and occasionally has numbness and tingling in both legs presumably from spinal stenosis. She denies history of diabetic neuropathy. She denies chest pain or shortness of breath at rest.  Objective: Filed Vitals:   02/09/14 1000  BP: 118/53  Pulse: 95  Temp:   Resp: 12   temperature 98.4. Oxygen saturation 97% on room air.  Intake/Output Summary (Last 24 hours) at 02/09/14 1034 Last data filed at 02/09/14 0600  Gross per 24 hour  Intake 3136.67 ml  Output   1350 ml  Net 1786.67 ml   Filed Weights   02/06/14 1357 02/06/14 1924  Weight: 71.668 kg (158 lb) 73.2 kg (161 lb 6 oz)    Exam:   General:  80 year old Caucasian woman laying in bed, in no acute distress.  Face/head: Decreasing scalp hematoma on the right for head with slightly decreased periorbital ecchymosis, mildly tender. Pupils equal, round, and reactive to light. Extraocular movements are intact.  Cardiovascular: S1, S2, with a soft systolic murmur.  Respiratory: Decreased breath sounds in the bases, clear anteriorly.  Abdomen: Positive bowel sounds, soft, nontender, nondistended.  Musculoskeletal: Right hip bandage in place, not taken off. Trace of pedal edema on the right. Left lower extremity unremarkable. Other than the right hip, no evidence of acute hot red joints. Back not examined.  Neurologic: She is alert and  oriented x3. Cranial nerves II through XII are intact. Vision and extraocular movements are grossly intact.   Data Reviewed: Basic Metabolic Panel:  Recent Labs Lab 02/06/14 1411 02/07/14 0513 02/09/14 0444  NA 141 141 136*  K 3.9 3.9 4.1  CL 100 102 100  CO2 24 27 26   GLUCOSE 136* 160* 193*  BUN 15 10 10   CREATININE 0.69 0.64 0.59  CALCIUM 9.9 9.2 8.2*   Liver Function Tests:  Recent Labs Lab 02/06/14 1411 02/07/14 0513  AST 35 26  ALT 28 24  ALKPHOS 56 52  BILITOT 0.4 0.5  PROT 7.7 7.0  ALBUMIN 4.4 3.8   No results found for this basename: LIPASE, AMYLASE,  in the last 168 hours No results found for this basename: AMMONIA,  in the last 168 hours CBC:  Recent Labs Lab 02/06/14 1411 02/07/14 0513 02/08/14 0512 02/08/14 1335 02/09/14 0444  WBC 14.1* 11.1* 18.5* 19.5* 19.4*  NEUTROABS 8.5*  --   --   --   --   HGB 14.1 12.9 13.0 12.0 11.3*  HCT 41.2 37.9 38.7 35.3* 33.8*  MCV 87.5 88.3 90.4 89.6 88.9  PLT 274 276 290 243 243   Cardiac Enzymes: No results found for this basename: CKTOTAL, CKMB, CKMBINDEX, TROPONINI,  in the last 168 hours BNP (last 3 results) No results found for this basename: PROBNP,  in the last 8760 hours CBG:  Recent Labs Lab 02/08/14 0837 02/08/14 1305 02/08/14 1647 02/08/14 2135 02/09/14 0749  GLUCAP 175* 151* 149* 156* 170*    Recent Results (from the past 240 hour(s))  MRSA PCR SCREENING  Status: None   Collection Time    02/07/14  2:15 PM      Result Value Ref Range Status   MRSA by PCR NEGATIVE  NEGATIVE Final   Comment:            The GeneXpert MRSA Assay (FDA     approved for NASAL specimens     only), is one component of a     comprehensive MRSA colonization     surveillance program. It is not     intended to diagnose MRSA     infection nor to guide or     monitor treatment for     MRSA infections.     Studies: Dg Pelvis Portable  02/08/2014   CLINICAL DATA:  Status post hip replacement  EXAM:  PORTABLE PELVIS 1-2 VIEWS  COMPARISON:  None.  FINDINGS: Right hip arthroplasty is noted.  No acute abnormality is seen.   Electronically Signed   By: Inez Catalina M.D.   On: 02/08/2014 13:35   Dg Chest Port 1 View  02/09/2014   CLINICAL DATA:  Leukocytosis.  EXAM: PORTABLE CHEST - 1 VIEW  COMPARISON:  02/06/2014  FINDINGS: Cardiac silhouette remains mildly enlarged, unchanged. Mild prominence of the central pulmonary arteries is again noted. Lungs are clear. No pleural effusion or pneumothorax is identified.  IMPRESSION: No active disease.   Electronically Signed   By: Logan Bores   On: 02/09/2014 10:19    Scheduled Meds: . amLODipine  5 mg Oral Daily  . atorvastatin  20 mg Oral q1800  . docusate sodium  100 mg Oral BID  . gabapentin  100 mg Oral QHS  . irbesartan  150 mg Oral Daily  . levothyroxine  125 mcg Oral QAC breakfast  . metFORMIN  1,000 mg Oral BID WC  . pantoprazole  40 mg Oral Daily  . senna  1 tablet Oral BID  . sertraline  100 mg Oral Daily   Continuous Infusions: . sodium chloride 100 mL/hr at 02/09/14 0700   Assessment and plan: Principal Problem:   Fracture of femoral neck, right Active Problems:   Traumatic hematoma of head   Acute blood loss anemia   HTN (hypertension)   Type 2 diabetes mellitus without complication   Hypothyroidism   Spinal stenosis   Hypoxia   History of splenectomy   Abnormal thyroid function test   1. Acute right femoral neck fracture/hip fracture secondary to fall.  Status post right hip arthroplasty on 9/18, by Dr. Aline Brochure. We'll continue supportive treatment and pain management. DVT prophylaxis and physical therapy per Dr. Aline Brochure.  Perioperative bleeding with acute blood loss anemia.  Per Dr. Aline Brochure, the patient had an appreciable amount of bleeding from the right femur during the operation. She was transfused one unit of packed red blood cells. Her hemoglobin has drifted down to 11.3 with equilibration and IV fluids. We'll  continue to monitor. No need for another transfusion at this point.  Large right-sided cephalohematoma, secondary to fall.  CT of her head revealed no intracranial hemorrhage or other acute intracranial abnormality. Neurologically, she is intact. The extent of the hematoma and ecchymosis  We will continue to monitor her and provide supportive treatment.  Transient preoperative hypoxia. Her oxygen saturation has improved and is 97% on room air this morning. Followup chest x-ray this morning reveals no active disease. Apparently, in the OR, her oxygen saturation fell to the 60s. She was placed on oxygen which improved her oxygen saturations to 100%.  Etiology unclear, but it appears to have been transient. It could have been a technical error with the oximetry machine. Incentive spirometry ordered.  Leukocytosis.  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 She is afebrile and has no sign of UTI or pneumonia on admission. Followup chest x-ray revealed no active disease. Followup urinalysis revealed no WBCs. Hematology was consulted to give recommendations on DVT prophylaxis in a patient with a history of ITP and splenectomy. Dr. Bishop Limbo via PA Mr. Sheldon Silvan recommended typical DVT prophylaxis.  History of spinal stenosis with chronic back and leg pain.  The patient appears to have low pain tolerance and continues to require and asks for IV pain medication every 2 hours. I informed her and her granddaughter that I was concerned about the amount of IV and oral pain medication she is receiving and that she will need to be weaned off of IV pain medication over the next 24-48 hours. We'll add Neurontin for neuropathy and titrate up as tolerated. She is scheduled to see neurosurgeon, Dr. Carloyn Manner in the outpatient setting.  Hypertension.  Her blood pressure is low-normal on ARB and amlodipine. We'll hold amlodipine.  Diabetes mellitus, type II. We'll  continue metformin. We'll monitor her CBGs before each meal. Her CBGs are titrating up, so we'll add sensitive sliding scale NovoLog.  Hypothyroidism.  We'll continue Synthroid. TSH is modestly low at 0.225. We'll order free T4 before entertaining changing the dose of Synthroid.  Depression. Zoloft was withheld on admission, but it will be restarted today.      Time spent: 35 minutes.    [place] [facility] Pager [phone]. If 7PM-7AM, please contact night-coverage at [email or web address], password [facility] 02/09/2014, 10:34 AM  LOS: 3 days

## 2014-02-09 NOTE — Progress Notes (Addendum)
Patient ID: Elaria Osias, female   DOB: 11/15/1933, 80 y.o.   MRN: [id number] POD # 1   RIGHT BIPOLAR   HG 11 (1 UNIT GIVEN INTRAOP)   SAT > 95 % NOW  NEEDS PHYSICAL THERAPY   OK TO GO TO REGULAR FLOOR   NOTE: SPINAL STENOSIS CAUSING EXCESSIVE PAIN, NUMBNESS.  START STEROIDS WITH INSULIN COVERAGE  GO TO Q 4 PAIN MEDS  AGREE WITH GABAPENTIN TITRATE UP TO 1800 MG DIVIDED IN 3 DOSES IF NEEDED AND IF TOLERATED

## 2014-02-09 NOTE — Evaluation (Signed)
Physical Therapy Evaluation Patient Details Name: Elizabeth [name] MRN: [id number] DOB: [birth year] Today's Date: 2014/02/12   History of Present Illness  Elizabeth [name] is a 80 yo female who has hx of spinal stenosis.  She was out shopping when her legs gave way on her she fell and sustained a fx of the Rt hip.  The pt had bipolar hip replacement on 02/08/2014 and is now being referred for theapy.    Clinical Impression  Pt is a 80 yo female who has decreased bed mobility, decreased strength and is currently unable to ambulate.  The patient did well for her first day of rehab.  She lives with her husband and may do very well in an inpatient rehab setting prior to going home with her husband.      Follow Up Recommendations SNF    Equipment Recommendations  None recommended by PT    Recommendations for Other Services   OT    Precautions / Restrictions Precautions Precautions: Fall Restrictions Weight Bearing Restrictions: Yes RLE Weight Bearing: Weight bearing as tolerated      Mobility  Bed Mobility Overal bed mobility: Needs Assistance Bed Mobility: Supine to Sit     Supine to sit: Mod assist        Transfers Overall transfer level: Needs assistance Equipment used: Rolling walker (2 wheeled) Transfers: Stand Pivot Transfers   Stand pivot transfers: Max assist          Ambulation/Gait Ambulation/Gait assistance:  (NT)                             Pertinent Vitals/Pain Pain Assessment: 0-10 Pain Score: 9  Pain Descriptors / Indicators: Aching Pain Intervention(s): Repositioned    Home Living   Living Arrangements: Spouse/significant other                    Prior Function Level of Independence: Independent                  Extremity/Trunk Assessment               Lower Extremity Assessment: RLE deficits/detail         Communication   Communication: No difficulties  Cognition Arousal/Alertness: Awake/alert   Overall  Cognitive Status: Within Functional Limits for tasks assessed                         Exercises Total Joint Exercises Ankle Circles/Pumps: Both;10 reps Quad Sets: Both;10 reps Gluteal Sets: Both;10 reps Heel Slides: Left;10 reps      Assessment/Plan    PT Assessment Patient needs continued PT services  PT Diagnosis Difficulty walking;Acute pain   PT Problem List Decreased strength;Decreased activity tolerance;Pain;Decreased balance;Decreased mobility  PT Treatment Interventions Gait training;Therapeutic exercise;Patient/family education;Functional mobility training   PT Goals (Current goals can be found in the Care Plan section)      Frequency 7X/week           End of Session Equipment Utilized During Treatment: Gait belt Activity Tolerance: Patient limited by pain Patient left: in chair;with call bell/phone within reach;with family/visitor present           Time: 3662-9476 PT Time Calculation (min): 40 min   Charges:   PT Evaluation $Initial PT Evaluation Tier I: 1 Procedure     PT G Codes:          Elizabeth [name],Elizabeth [name] 02-12-14, 12:37 PM

## 2014-02-10 DIAGNOSIS — Z9181 History of falling: Secondary | ICD-10-CM

## 2014-02-10 LAB — GLUCOSE, CAPILLARY
GLUCOSE-CAPILLARY: 169 mg/dL — AB (ref 70–99)
Glucose-Capillary: 172 mg/dL — ABNORMAL HIGH (ref 70–99)
Glucose-Capillary: 185 mg/dL — ABNORMAL HIGH (ref 70–99)
Glucose-Capillary: 207 mg/dL — ABNORMAL HIGH (ref 70–99)

## 2014-02-10 LAB — CBC
HCT: 30.6 % — ABNORMAL LOW (ref 36.0–46.0)
Hemoglobin: 10.5 g/dL — ABNORMAL LOW (ref 12.0–15.0)
MCH: 30.3 pg (ref 26.0–34.0)
MCHC: 34.3 g/dL (ref 30.0–36.0)
MCV: 88.2 fL (ref 78.0–100.0)
Platelets: 280 10*3/uL (ref 150–400)
RBC: 3.47 MIL/uL — ABNORMAL LOW (ref 3.87–5.11)
RDW: 14.7 % (ref 11.5–15.5)
WBC: 17.6 10*3/uL — ABNORMAL HIGH (ref 4.0–10.5)

## 2014-02-10 LAB — BASIC METABOLIC PANEL
Anion gap: 11 (ref 5–15)
BUN: 11 mg/dL (ref 6–23)
CHLORIDE: 102 meq/L (ref 96–112)
CO2: 27 mEq/L (ref 19–32)
Calcium: 8.6 mg/dL (ref 8.4–10.5)
Creatinine, Ser: 0.52 mg/dL (ref 0.50–1.10)
GFR calc Af Amer: >90 mL/min (ref >90)
GFR calc non Af Amer: 89 mL/min — ABNORMAL LOW (ref >90)
GLUCOSE: 200 mg/dL — AB (ref 70–99)
POTASSIUM: 4.1 meq/L (ref 3.7–5.3)
SODIUM: 140 meq/L (ref 137–147)

## 2014-02-10 LAB — T4, FREE: Free T4: 1.38 ng/dL (ref 0.80–1.80)

## 2014-02-10 MED ORDER — INSULIN DETEMIR 100 UNIT/ML ~~LOC~~ SOLN
7.0000 [IU] | Freq: Two times a day (BID) | SUBCUTANEOUS | Status: DC
  Administered 2014-02-10 – 2014-02-12 (×5): 7 [IU] via SUBCUTANEOUS
  Filled 2014-02-10 (×9): qty 0.07

## 2014-02-10 MED ORDER — INSULIN ASPART 100 UNIT/ML ~~LOC~~ SOLN
0.0000 [IU] | Freq: Every day | SUBCUTANEOUS | Status: DC
Start: 2014-02-10 — End: 2014-02-12

## 2014-02-10 MED ORDER — PREDNISONE 10 MG PO TABS
30.0000 mg | ORAL_TABLET | Freq: Two times a day (BID) | ORAL | Status: DC
  Administered 2014-02-10 – 2014-02-12 (×4): 30 mg via ORAL
  Filled 2014-02-10 (×8): qty 1

## 2014-02-10 MED ORDER — HYDROMORPHONE HCL 1 MG/ML IJ SOLN
0.5000 mg | INTRAMUSCULAR | Status: DC | PRN
  Administered 2014-02-10: 0.5 mg via INTRAVENOUS
  Filled 2014-02-10: qty 1

## 2014-02-10 MED ORDER — AMLODIPINE BESYLATE 5 MG PO TABS
5.0000 mg | ORAL_TABLET | Freq: Every day | ORAL | Status: DC
  Administered 2014-02-11 – 2014-02-12 (×2): 5 mg via ORAL
  Filled 2014-02-10 (×2): qty 1

## 2014-02-10 MED ORDER — INSULIN ASPART 100 UNIT/ML ~~LOC~~ SOLN
0.0000 [IU] | Freq: Three times a day (TID) | SUBCUTANEOUS | Status: DC
  Administered 2014-02-10 – 2014-02-11 (×3): 3 [IU] via SUBCUTANEOUS
  Administered 2014-02-11 (×2): 2 [IU] via SUBCUTANEOUS
  Administered 2014-02-12 (×2): 3 [IU] via SUBCUTANEOUS

## 2014-02-10 MED ORDER — GABAPENTIN 100 MG PO CAPS
200.0000 mg | ORAL_CAPSULE | Freq: Every day | ORAL | Status: DC
  Administered 2014-02-10 – 2014-02-11 (×2): 200 mg via ORAL
  Filled 2014-02-10 (×2): qty 2

## 2014-02-10 NOTE — Addendum Note (Signed)
Addendum created 02/10/14 1008 by Mickel Baas, CRNA   Modules edited: Notes Section   Notes Section:  File: [id number]

## 2014-02-10 NOTE — Progress Notes (Signed)
[facility] PROGRESS NOTE  Elizabeth [name] HDQ:[id number] DOB: 1934-03-12 DOA: 02/06/2014 PCP: No primary provider on file.  Assessment/Plan  Code Status: Full code Family Communication: Discussed with her granddaughter Disposition Plan: Likely discharge to skilled nursing facility when medically appropriate.   Consultants:  Orthopedic, Dr. Aline Brochure  Procedures:  Right hip arthroplasty, 02/08/14.  Antibiotics:  Preop clindamycin 02/08/14.  HPI/Subjective: The patient has less pain in her legs and her back. She complains of frequent urination from the IV fluids. The Foley catheter has been discontinued.  Objective: Filed Vitals:   02/10/14 0800  BP: 125/70  Pulse: 71  Temp: 97.9 F (36.6 C)  Resp: 18   temperature 98.4. Oxygen saturation 92-95% on room air.  Intake/Output Summary (Last 24 hours) at 02/10/14 1003 Last data filed at 02/10/14 0900  Gross per 24 hour  Intake   2105 ml  Output   1550 ml  Net    555 ml   Filed Weights   02/06/14 1357 02/06/14 1924 02/10/14 0600  Weight: 71.668 kg (158 lb) 73.2 kg (161 lb 6 oz) 73.2 kg (161 lb 6 oz)    Exam:   General:  80 year old Caucasian woman laying in bed, in no acute distress.  Face/head: Decreasing scalp hematoma on the right for head with decreasing periorbital ecchymosis, mildly tender. Pupils equal, round, and reactive to light. Extraocular movements are intact.  Cardiovascular: S1, S2, with a soft systolic murmur.  Respiratory: Decreased breath sounds in the bases, clear anteriorly.  Abdomen: Positive bowel sounds, soft, nontender, nondistended.  Musculoskeletal: Right hip bandage in place, not taken off-slightly blood-tinged. Trace of pedal edema on the right. Left lower extremity unremarkable. Other than the right hip, no evidence of acute hot red joints. Back not examined.  Neurologic: She is alert and oriented x3. Cranial nerves II through XII are intact. Vision and extraocular movements are  grossly intact.   Data Reviewed: Basic Metabolic Panel:  Recent Labs Lab 02/06/14 1411 02/07/14 0513 02/09/14 0444 02/10/14 0533  NA 141 141 136* 140  K 3.9 3.9 4.1 4.1  CL 100 102 100 102  CO2 24 27 26 27   GLUCOSE 136* 160* 193* 200*  BUN 15 10 10 11   CREATININE 0.69 0.64 0.59 0.52  CALCIUM 9.9 9.2 8.2* 8.6   Liver Function Tests:  Recent Labs Lab 02/06/14 1411 02/07/14 0513  AST 35 26  ALT 28 24  ALKPHOS 56 52  BILITOT 0.4 0.5  PROT 7.7 7.0  ALBUMIN 4.4 3.8   No results found for this basename: LIPASE, AMYLASE,  in the last 168 hours No results found for this basename: AMMONIA,  in the last 168 hours CBC:  Recent Labs Lab 02/06/14 1411 02/07/14 0513 02/08/14 0512 02/08/14 1335 02/09/14 0444 02/10/14 0533  WBC 14.1* 11.1* 18.5* 19.5* 19.4* 17.6*  NEUTROABS 8.5*  --   --   --   --   --   HGB 14.1 12.9 13.0 12.0 11.3* 10.5*  HCT 41.2 37.9 38.7 35.3* 33.8* 30.6*  MCV 87.5 88.3 90.4 89.6 88.9 88.2  PLT 274 276 290 243 243 280   Cardiac Enzymes: No results found for this basename: CKTOTAL, CKMB, CKMBINDEX, TROPONINI,  in the last 168 hours BNP (last 3 results) No results found for this basename: PROBNP,  in the last 8760 hours CBG:  Recent Labs Lab 02/09/14 0749 02/09/14 1120 02/09/14 1630 02/09/14 2140 02/10/14 0743  GLUCAP 170* 185* 305* 238* 172*    Recent Results (from the past 240  hour(s))  MRSA PCR SCREENING     Status: None   Collection Time    02/07/14  2:15 PM      Result Value Ref Range Status   MRSA by PCR NEGATIVE  NEGATIVE Final   Comment:            The GeneXpert MRSA Assay (FDA     approved for NASAL specimens     only), is one component of a     comprehensive MRSA colonization     surveillance program. It is not     intended to diagnose MRSA     infection nor to guide or     monitor treatment for     MRSA infections.     Studies: Dg Pelvis Portable  02/08/2014   CLINICAL DATA:  Status post hip replacement  EXAM:  PORTABLE PELVIS 1-2 VIEWS  COMPARISON:  None.  FINDINGS: Right hip arthroplasty is noted.  No acute abnormality is seen.   Electronically Signed   By: Inez Catalina M.D.   On: 02/08/2014 13:35   Dg Chest Port 1 View  02/09/2014   CLINICAL DATA:  Leukocytosis.  EXAM: PORTABLE CHEST - 1 VIEW  COMPARISON:  02/06/2014  FINDINGS: Cardiac silhouette remains mildly enlarged, unchanged. Mild prominence of the central pulmonary arteries is again noted. Lungs are clear. No pleural effusion or pneumothorax is identified.  IMPRESSION: No active disease.   Electronically Signed   By: Logan Bores   On: 02/09/2014 10:19    Scheduled Meds: . atorvastatin  20 mg Oral q1800  . docusate sodium  100 mg Oral BID  . enoxaparin (LOVENOX) injection  30 mg Subcutaneous Q24H  . gabapentin  100 mg Oral QHS  . HYDROcodone-acetaminophen  1 tablet Oral Q4H  . insulin aspart  0-9 Units Subcutaneous TID WC  . irbesartan  150 mg Oral Daily  . levothyroxine  125 mcg Oral QAC breakfast  . metFORMIN  1,000 mg Oral BID WC  . methylPREDNISolone (SOLU-MEDROL) injection  40 mg Intravenous Q6H  . pantoprazole  40 mg Oral Daily  . senna  1 tablet Oral BID  . sertraline  100 mg Oral Daily   Continuous Infusions: . sodium chloride 75 mL/hr at 02/10/14 0900   Assessment and plan: Principal Problem:   Fracture of femoral neck, right Active Problems:   Traumatic hematoma of head   Acute blood loss anemia   HTN (hypertension)   Type 2 diabetes mellitus without complication   Hypothyroidism   Spinal stenosis   Hypoxia   History of splenectomy   At high risk for injury related to fall   1. Acute right femoral neck fracture/hip fracture secondary to fall.  Status post right hip arthroplasty on 9/18, by Dr. Aline Brochure. We'll continue supportive treatment and pain management. DVT prophylaxis and physical therapy per Dr. Aline Brochure.  History of spinal stenosis with chronic back and leg pain.  The patient's pain has subsided, but  she appears to have a low pain tolerance. Gabapentin was started at bedtime on 02/09/14, but will increase it to 200 mg each bedtime without further increase for now. Dr. Aline Brochure also ordered steroid IV therapy, but I believe she could be treated with oral prednisone instead. Will change the frequency of IV Dilaudid from every 2 hours to every 4 hours. Continue diazepam when necessary for muscle spasms. She is scheduled to see neurosurgeon, Dr. Carloyn Manner in the outpatient setting.  Perioperative bleeding with acute blood loss anemia.  Per Dr.  Aline Brochure, the patient had an appreciable amount of bleeding from the right femur during the operation. She was transfused one unit of packed red blood cells perioperatively. Her hemoglobin continues to drift down with equilibration and IV fluids. We'll decrease her IV fluids and continue to monitor. If her hemoglobin falls below 9.8,  would consider transfusing her another unit of packed red blood cells, particularly in light of DVT prophylaxis with Lovenox. Continue PPI empirically  Large right-sided cephalohematoma, secondary to fall.  CT of her head revealed no intracranial hemorrhage or other acute intracranial abnormality. Neurologically, she is intact. The extent of the hematoma and ecchymosis  We will continue to monitor her and provide supportive treatment.  Transient preoperative hypoxia. Her oxygen saturation has improved on room air. Followup chest x-ray on 9/19 revealed no active disease. Apparently, in the OR, her oxygen saturation fell to the 60s. She was placed on oxygen which improved her oxygen saturations to 100%. Etiology unclear, but it appears to have been transient. It could have been a technical error with the oximetry machine. Incentive spirometry ordered.  Leukocytosis.  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 The start of steroids may also cause an increase further. She is afebrile and  has no sign of UTI or pneumonia on admission. Followup chest x-ray revealed no active disease. Followup urinalysis revealed no WBCs. Hematology was consulted to give recommendations on DVT prophylaxis in a patient with a history of ITP and splenectomy. Dr. Bishop Limbo via PA Mr. Sheldon Silvan recommended typical DVT prophylaxis.  Hypertension.  Her blood pressure is low-normal on ARB and amlodipine. Amlodipine held for 24 hours, but will be restarted tomorrow morning.  Diabetes mellitus, type II. We'll continue metformin. We'll monitor her CBGs before each meal. Her CBGs are trending up from steroid treatment, so sliding scale NovoLog and Levemir ordered. Her hemoglobin A1c was 6.9.  Hypothyroidism.  We'll continue Synthroid. TSH is modestly low at 0.225. We'll order free T4 before entertaining changing the dose of Synthroid.  Depression. Zoloft was withheld on admission, but it was restarted.      Time spent: 25 minutes.    [facility] Pager [phone]. If 7PM-7AM, please contact night-coverage at [email or web address], password [facility] 02/10/2014, 10:03 AM  LOS: 4 days

## 2014-02-10 NOTE — Progress Notes (Signed)
Pt to be transferred to room 330. Pt and family are aware and agreeable to transfer. Receiving nurse has been given report and is agreeable to accepting pt. Will transfer pt via bed with all belongings.

## 2014-02-10 NOTE — Progress Notes (Signed)
Physical Therapy Treatment Patient Details Name: Elizabeth [name] MRN: [id number] DOB: Sep 26, 1933 Today's Date: 18-Feb-2014    History of Present Illness Elizabeth [name] is a 80 yo female who has hx of spinal stenosis.  She was out shopping when her legs gave way on her she fell and sustained a fx of the Rt hip.  The pt had bipolar hip replacement on 02/08/2014 and is now being referred for theapy.      PT Comments    Pt progressing well.  Explained the difference between IP rehab and SNF.    Follow Up Recommendations  CIR     Equipment Recommendations    none   Recommendations for Other Services  OT     Precautions / Restrictions Precautions Precautions: Fall Restrictions Weight Bearing Restrictions: Yes RLE Weight Bearing: Weight bearing as tolerated    Mobility  Bed Mobility   Bed Mobility: Supine to Sit     Supine to sit: Min assist        Transfers Overall transfer level: Needs assistance Equipment used: Rolling walker (2 wheeled)     Stand pivot transfers: Mod assist          Ambulation/Gait Ambulation/Gait assistance: Min assist Ambulation Distance (Feet): 3 Feet Assistive device: Rolling walker (2 wheeled)     Gait velocity interpretation: Below normal speed for age/gender               Exercises Total Joint Exercises Ankle Circles/Pumps: AROM;Both;10 reps Quad Sets: 10 reps Heel Slides: 10 reps        Pertinent Vitals/Pain Pain Score: 9  Pain Intervention(s): RN gave pain meds during session       Prior Function   I with cane         PT Goals (current goals can now be found in the care plan section) Progress towards PT goals: Progressing toward goals    Frequency  7X/week    PT Plan Discharge plan needs to be updated       End of Session Equipment Utilized During Treatment: Gait belt Activity Tolerance: Patient limited by pain Patient left: in chair;with call bell/phone within reach;with family/visitor present      Time: 1325-1414 PT Time Calculation (min): 49 min  Charges:  $Gait Training: 8-22 mins $Therapeutic Exercise: 8-22 mins $Therapeutic Activity: 8-22 mins                    G Codes:      RUSSELL,CINDY 02-18-14, 2:15 PM

## 2014-02-10 NOTE — Anesthesia Postprocedure Evaluation (Signed)
  Anesthesia Post-op Note  Patient: Elizabeth [name]  Procedure(s) Performed: Procedure(s): ARTHROPLASTY BIPOLAR HIP (Right)  Patient Location: ICU 6  Anesthesia Type:Spinal  Level of Consciousness: awake, alert , oriented and patient cooperative  Airway and Oxygen Therapy: Patient Spontanous Breathing and Patient connected to nasal cannula oxygen  Post-op Pain: moderate  Post-op Assessment: Post-op Vital signs reviewed, Patient's Cardiovascular Status Stable, Respiratory Function Stable, Patent Airway, No signs of Nausea or vomiting and Pain level controlled  Post-op Vital Signs: Reviewed and stable  Last Vitals:  Filed Vitals:   02/10/14 0800  BP: 125/70  Pulse: 71  Temp: 36.6 C  Resp: 18    Complications: No apparent anesthesia complications

## 2014-02-11 LAB — TYPE AND SCREEN
ABO/RH(D): A POS
Antibody Screen: NEGATIVE
UNIT DIVISION: 0
UNIT DIVISION: 0
Unit division: 0

## 2014-02-11 LAB — CBC
HCT: 30 % — ABNORMAL LOW (ref 36.0–46.0)
Hemoglobin: 10.3 g/dL — ABNORMAL LOW (ref 12.0–15.0)
MCH: 30.3 pg (ref 26.0–34.0)
MCHC: 34.3 g/dL (ref 30.0–36.0)
MCV: 88.2 fL (ref 78.0–100.0)
Platelets: 338 10*3/uL (ref 150–400)
RBC: 3.4 MIL/uL — ABNORMAL LOW (ref 3.87–5.11)
RDW: 15 % (ref 11.5–15.5)
WBC: 19.1 10*3/uL — ABNORMAL HIGH (ref 4.0–10.5)

## 2014-02-11 LAB — GLUCOSE, CAPILLARY
GLUCOSE-CAPILLARY: 175 mg/dL — AB (ref 70–99)
GLUCOSE-CAPILLARY: 136 mg/dL — AB (ref 70–99)
Glucose-Capillary: 132 mg/dL — ABNORMAL HIGH (ref 70–99)
Glucose-Capillary: 130 mg/dL — ABNORMAL HIGH (ref 70–99)
Glucose-Capillary: 139 mg/dL — ABNORMAL HIGH (ref 70–99)

## 2014-02-11 MED ORDER — HYDROCODONE-ACETAMINOPHEN 10-325 MG PO TABS
1.0000 | ORAL_TABLET | ORAL | Status: DC
  Administered 2014-02-11 (×2): 1 via ORAL
  Administered 2014-02-12 (×3): 2 via ORAL
  Filled 2014-02-11 (×2): qty 1
  Filled 2014-02-11 (×3): qty 2

## 2014-02-11 NOTE — Progress Notes (Signed)
Dressing to right hip changed as ordered. Dulcolax tablet given for constipation at this time. Patient tolerated well.

## 2014-02-11 NOTE — Progress Notes (Signed)
Physical Therapy Treatment Patient Details Name: Elizabeth [name] MRN: [id number] DOB: Sep 27, 1933 Today's Date: 02/11/2014    History of Present Illness [name] is a 80 yo female who has hx of spinal stenosis.  She was out shopping when her legs gave way on her she fell and sustained a fx of the Rt hip.  The pt had bipolar hip replacement on 02/08/2014 and is now being referred for theapy.      PT Comments    Pt reports increased fatigue today, pain remains at a "6" on 0-10 scale.  All anterior THR precautions were reviewed with pt.  She was instructed in transfer in and out of bed and pivot to [facility] using walker.  She is still unable to offload any weight from her LLE in order to take a step and transfer is quite labored.  She states that she anticipates being transferred to SNF today.  Follow Up Recommendations  SNF     Equipment Recommendations  None recommended by PT    Recommendations for Other Services  OT     Precautions / Restrictions Precautions Precautions: Fall;Anterior Hip Precaution Booklet Issued: No Restrictions Weight Bearing Restrictions: No RLE Weight Bearing: Weight bearing as tolerated    Mobility  Bed Mobility Overal bed mobility: Needs Assistance Bed Mobility: Supine to Sit     Supine to sit: Min assist     General bed mobility comments: instructed in how to scoot to edge of bed prior to transfer  Transfers Overall transfer level: Needs assistance Equipment used: Rolling walker (2 wheeled) Transfers: Sit to/from Stand Sit to Stand: Mod assist         General transfer comment: pt unable to take any steps due to fatigue....she is unable to offload any weight from the left LE in order to take a step and she slides each foot to move it  Ambulation/Gait                 Stairs            Wheelchair Mobility    Modified Rankin (Stroke Patients Only)       Balance                                    Cognition  Arousal/Alertness: Awake/alert Behavior During Therapy: WFL for tasks assessed/performed Overall Cognitive Status: Within Functional Limits for tasks assessed                      Exercises      General Comments        Pertinent Vitals/Pain Pain Assessment: 0-10 Pain Score: 6  Pain Location: right hip...after pain med Pain Descriptors / Indicators: Aching Pain Intervention(s): Limited activity within patient's tolerance;Premedicated before session;Repositioned    Home Living                      Prior Function            PT Goals (current goals can now be found in the care plan section) Progress towards PT goals: Progressing toward goals;Not progressing toward goals - comment (due to fatigue)    Frequency  7X/week    PT Plan Discharge plan needs to be updated    Co-evaluation             End of Session Equipment Utilized During Treatment: Gait belt Activity Tolerance: Patient  limited by fatigue Patient left: in bed;with call bell/phone within reach;with bed alarm set     Time: 1561-5379 PT Time Calculation (min): 30 min  Charges:  $Therapeutic Activity: 8-22 mins $Self Care/Home Management: 8-22                    G Codes:      Sable Feil February 12, 2014, 2:12 PM

## 2014-02-11 NOTE — Clinical Social Work Placement (Signed)
Clinical Social Work Department CLINICAL SOCIAL WORK PLACEMENT NOTE 02/11/2014  Patient:  [name]  Account Number:  [id number] Admit date:  02/06/2014  Clinical Social Worker:  Benay Pike, LCSW  Date/time:  02/07/2014 02:51 PM  Clinical Social Work is seeking post-discharge placement for this patient at the following level of care:   SKILLED NURSING   (*CSW will update this form in Epic as items are completed)   02/07/2014  Patient/family provided with [facility] Department of Clinical Social Work's list of facilities offering this level of care within the geographic area requested by the patient (or if unable, by the patient's family).  02/07/2014  Patient/family informed of their freedom to choose among providers that offer the needed level of care, that participate in Medicare, Medicaid or managed care program needed by the patient, have an available bed and are willing to accept the patient.  02/07/2014  Patient/family informed of [facility]' ownership interest in [facility], as well as of the fact that they are under no obligation to receive care at this facility.  PASARR submitted to EDS on  PASARR number received on   FL2 transmitted to all facilities in geographic area requested by pt/family on  02/07/2014 FL2 transmitted to all facilities within larger geographic area on   Patient informed that his/her managed care company has contracts with or will negotiate with  certain facilities, including the following:     Patient/family informed of bed offers received:  02/11/2014 Patient chooses bed at Other Physician recommends and patient chooses bed at  Other  Patient to be transferred to  on   Patient to be transferred to facility by  Patient and family notified of transfer on  Name of family member notified:    The following physician request were entered in Epic:   Additional Comments: No pasarr needed per [place] facilities. Chose  [facility].  Benay Pike, [place]

## 2014-02-11 NOTE — Evaluation (Signed)
Occupational Therapy Evaluation Patient Details Name: Haruna Rohlfs MRN: [id number] DOB: 1933/09/13 Today's Date: 02/11/2014    History of Present Illness Ms. Hopman is a 80 yo female who has hx of spinal stenosis.  She was out shopping when her legs gave way on her she fell and sustained a fx of the Rt hip.  The pt had bipolar hip replacement on 02/08/2014 and is now being referred for theapy.     Clinical Impression   Pt is presenting for acute OT with above situation.  She is presenting with good strength and ROM in BUE, increqasing her ability to engage in ADL and IADL skills.  Per PT report she is requireing min-mos assis tiwth transfers, and will continue to require support after d/c.  She was independent with ADL skills prior to her fall, and will require min-mod assist with ADL skills currenlty.and benefit from AE instruction.  Recommend SNF OT services at this time.    Follow Up Recommendations  SNF    Equipment Recommendations  Other (comment) (Defer to SNF, likely needs shower chair )    Recommendations for Other Services       Precautions / Restrictions Precautions Precautions: Fall Restrictions Weight Bearing Restrictions: Yes RLE Weight Bearing: Weight bearing as tolerated      Mobility Bed Mobility                  Transfers                      Balance                                            ADL Overall ADL's : Needs assistance/impaired Eating/Feeding: Set up   Grooming: Set up               Lower Body Dressing: Moderate assistance                       Vision                     Perception     Praxis      Pertinent Vitals/Pain Pain Assessment: 0-10 Pain Score: 5  Pain Location: Right Hip Pain Descriptors / Indicators: Aching Pain Intervention(s): Repositioned;Premedicated before session     Hand Dominance Right   Extremity/Trunk Assessment Upper Extremity Assessment Upper  Extremity Assessment: Overall WFL for tasks assessed   Lower Extremity Assessment Lower Extremity Assessment: Defer to PT evaluation       Communication Communication Communication: No difficulties   Cognition Arousal/Alertness: Awake/alert Behavior During Therapy: WFL for tasks assessed/performed Overall Cognitive Status: Within Functional Limits for tasks assessed                     General Comments       Exercises       Shoulder Instructions      Home Living Family/patient expects to be discharged to:: Skilled nursing facility Living Arrangements: Spouse/significant other Available Help at Discharge: Family Type of Home: House Home Access: Stairs to enter     Home Layout: Two level;Bed/bath upstairs Alternate Level Stairs-Number of Steps: Pt has stair lift chair   Bathroom Shower/Tub: Tub/shower unit;Walk-in shower   Bathroom Toilet: Handicapped height     Home Equipment: [place] - single point  Prior Functioning/Environment Level of Independence: Independent        Comments: Granddaughter would assist with grocery shopping and vaccuuming    OT Diagnosis: Acute pain (Decreased ADL status)   OT Problem List: Pain;Decreased knowledge of use of DME or AE (Decreased ADL status)   OT Treatment/Interventions: Self-care/ADL training;Therapeutic exercise;Energy conservation;DME and/or AE instruction;Patient/family education;Balance training    OT Goals(Current goals can be found in the care plan section) Acute Rehab OT Goals Patient Stated Goal: to get back home OT Goal Formulation: With patient Time For Goal Achievement: 02/25/14 Potential to Achieve Goals: Good ADL Goals Pt Will Perform Lower Body Dressing: with min guard assist Pt Will Transfer to Toilet: with min guard assist Pt Will Perform Tub/Shower Transfer: with min guard assist  OT Frequency: Min 2X/week   Barriers to D/C:            Co-evaluation              End of  Session    Activity Tolerance: Patient tolerated treatment well Patient left: in bed;with call bell/phone within reach;with bed alarm set   Time: 1505-6979 OT Time Calculation (min): 16 min Charges:  OT General Charges $OT Visit: 1 Procedure OT Evaluation $Initial OT Evaluation Tier I: 1 Procedure G-Codes:     Bea Graff, MS, OTR/L [phone]  02/11/2014, 9:19 AM

## 2014-02-11 NOTE — Progress Notes (Signed)
The patient was seen and examined. She was discussed with nurse practitioner, Ms. Renard Hamper. Agree with her assessment and plan as we've discussed. Plan for discharge to the skilled nursing facility tomorrow. We'll wean prednisone as appropriate.

## 2014-02-11 NOTE — Clinical Social Work Note (Signed)
CSW met with pt this morning to discuss d/c planning further. She definitely feels she will need to go to SNF at d/c and would like [facility]. Pt's daughter called and asked about [facility]. CSW explained with pt's permission that this was mentioned to her that family had asked about [facility] and she wants to go to [facility]. CSW left voicemail at [facility] notifying them of probable d/c tomorrow.  Benay Pike, [place]

## 2014-02-11 NOTE — Progress Notes (Signed)
Patient ID: Elizabeth [name], female   DOB: 12/01/1933, 80 y.o.   MRN: [id number]  Status post bipolar right  hip replacement  Weightbearing as tolerated Continue oral steroids Scheduled appointment to see Dr. Marga Hoots for spinal stenosis Staples out postop day 12 Followup with Dr. Aline Brochure postop day 28 Anticoagulation for 28 days postop Abduction pillow x1 month

## 2014-02-11 NOTE — Progress Notes (Signed)
[facility] PROGRESS NOTE  Glennie Bose TGG:[id number] DOB: May 27, 1933 DOA: 02/06/2014 PCP: No primary provider on file.  Assessment/Plan: 1. Acute right femoral neck fracture/hip fracture secondary to fall.  Status post right hip arthroplasty on 9/18, by Dr. Aline Brochure. Pain managed well. continue supportive treatment. Will hopefully be discharge tomorrow to facility. DVT prophylaxis and physical therapy per Dr. Aline Brochure.   History of spinal stenosis with chronic back and leg pain.  The patient's pain has subsided, but she appears to have a low pain tolerance. Gabapentin was started at bedtime on 02/09/14, but will increase it to 200 mg each bedtime without further increase for now. Dr. Aline Brochure also ordered steroid IV therapy. Transitioned to po 02/10/14. Will need to plan taper at discharge. Of note was scheduled to see Dr. Carloyn Manner this week for follow up.  Will discontinue IV pain medicine . Continue diazepam when necessary for muscle spasms. S  Perioperative bleeding with acute blood loss anemia.  Per Dr. Aline Brochure, the patient had an appreciable amount of bleeding from the right femur during the operation. She was transfused one unit of packed red blood cells perioperatively. Her hemoglobin stable at 10.3-10.5.  Continue PPI empirically   Large right-sided cephalohematoma, secondary to fall.  CT of her head revealed no intracranial hemorrhage or other acute intracranial abnormality. Neurologically, she remains intact. Due to  extent of the hematoma and ecchymosis we will continue to monitor her and provide supportive treatment.   Transient preoperative hypoxia.  Her oxygen saturation 95% room air. Followup chest x-ray on 9/19 revealed no active disease. Apparently, in the OR, her oxygen saturation fell to the 60s. She was placed on oxygen which improved her oxygen saturations to 100%. Etiology unclear, but it appears to have been transient. It could have been a technical error with the oximetry  machine. Incentive spirometry ordered.   Leukocytosis.  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 She remainss afebrile and has no sign of UTI or pneumonia on admission. Followup chest x-ray revealed no active disease. Followup urinalysis revealed no WBCs. Hematology was consulted to give recommendations on DVT prophylaxis in a patient with a history of ITP and splenectomy. Dr. Bishop Limbo via PA Mr. Sheldon Silvan recommended typical DVT prophylaxis.   Hypertension.  Her blood pressure is controlled  on ARB and amlodipine. Amlodipine held for 24 hours, but will be restarted tomorrow morning.   Diabetes mellitus, type II.  We'll continue metformin. CBGs range 130-139. Continue  NovoLog and Levemir. Her hemoglobin A1c was 6.9.   Hypothyroidism.  We'll continue Synthroid. TSH is modestly low at 0.225. Free T4 1.38.   Depression.   stable at baseline. Zoloft was withheld on admission, but it was restarted.    Code Status: full Family Communication: none present Disposition Plan: to facility tomorrow   Consultants:  Orthopedics Dr Aline Brochure  Procedures:  Right hip arthroplasty 02/08/14  Antibiotics: Preop clindamycin 02/08/14   HPI/Subjective: Awake alert reports good pain control. Verbalizes anxiety around getting up to go to bathroom.   Objective: Filed Vitals:   02/11/14 1200  BP:   Pulse:   Temp:   Resp: 18    Intake/Output Summary (Last 24 hours) at 02/11/14 1457 Last data filed at 02/11/14 1246  Gross per 24 hour  Intake    600 ml  Output   1475 ml  Net   -875 ml   Filed Weights   02/06/14  1357 02/06/14 1924 02/10/14 0600  Weight: 71.668 kg (158 lb) 73.2 kg (161 lb 6 oz) 73.2 kg (161 lb 6 oz)    Exam:   General:  Well nourished appears comfortable  Cardiovascular: S1 and S2 +murmur no no LE edema PPP  Respiratory: normal effort BS diminished somewhat but clear. No  crackles or wheeze  Abdomen: obese soft +BS non-tender to palpation  Musculoskeletal: dressing right hip dry and intact. Right foot warm to touch.    Data Reviewed: Basic Metabolic Panel:  Recent Labs Lab 02/06/14 1411 02/07/14 0513 02/09/14 0444 02/10/14 0533  NA 141 141 136* 140  K 3.9 3.9 4.1 4.1  CL 100 102 100 102  CO2 24 27 26 27   GLUCOSE 136* 160* 193* 200*  BUN 15 10 10 11   CREATININE 0.69 0.64 0.59 0.52  CALCIUM 9.9 9.2 8.2* 8.6   Liver Function Tests:  Recent Labs Lab 02/06/14 1411 02/07/14 0513  AST 35 26  ALT 28 24  ALKPHOS 56 52  BILITOT 0.4 0.5  PROT 7.7 7.0  ALBUMIN 4.4 3.8   No results found for this basename: LIPASE, AMYLASE,  in the last 168 hours No results found for this basename: AMMONIA,  in the last 168 hours CBC:  Recent Labs Lab 02/06/14 1411  02/08/14 0512 02/08/14 1335 02/09/14 0444 02/10/14 0533 02/11/14 0608  WBC 14.1*  < > 18.5* 19.5* 19.4* 17.6* 19.1*  NEUTROABS 8.5*  --   --   --   --   --   --   HGB 14.1  < > 13.0 12.0 11.3* 10.5* 10.3*  HCT 41.2  < > 38.7 35.3* 33.8* 30.6* 30.0*  MCV 87.5  < > 90.4 89.6 88.9 88.2 88.2  PLT 274  < > 290 243 243 280 338  < > = values in this interval not displayed. Cardiac Enzymes: No results found for this basename: CKTOTAL, CKMB, CKMBINDEX, TROPONINI,  in the last 168 hours BNP (last 3 results) No results found for this basename: PROBNP,  in the last 8760 hours CBG:  Recent Labs Lab 02/10/14 1646 02/10/14 2208 02/11/14 0740 02/11/14 1005 02/11/14 1204  GLUCAP 185* 207* 132* 130* 139*    Recent Results (from the past 240 hour(s))  MRSA PCR SCREENING     Status: None   Collection Time    02/07/14  2:15 PM      Result Value Ref Range Status   MRSA by PCR NEGATIVE  NEGATIVE Final   Comment:            The GeneXpert MRSA Assay (FDA     approved for NASAL specimens     only), is one component of a     comprehensive MRSA colonization     surveillance program. It is not      intended to diagnose MRSA     infection nor to guide or     monitor treatment for     MRSA infections.     Studies: No results found.  Scheduled Meds: . amLODipine  5 mg Oral Daily  . atorvastatin  20 mg Oral q1800  . docusate sodium  100 mg Oral BID  . enoxaparin (LOVENOX) injection  30 mg Subcutaneous Q24H  . gabapentin  200 mg Oral QHS  . HYDROcodone-acetaminophen  1 tablet Oral Q4H  . insulin aspart  0-15 Units Subcutaneous TID WC  . insulin aspart  0-5 Units Subcutaneous QHS  . insulin detemir  7 Units Subcutaneous BID  .  irbesartan  150 mg Oral Daily  . levothyroxine  125 mcg Oral QAC breakfast  . metFORMIN  1,000 mg Oral BID WC  . pantoprazole  40 mg Oral Daily  . predniSONE  30 mg Oral BID WC  . senna  1 tablet Oral BID  . sertraline  100 mg Oral Daily   Continuous Infusions: . sodium chloride 30 mL/hr at 02/10/14 1200    Principal Problem:   Fracture of femoral neck, right Active Problems:   HTN (hypertension)   Type 2 diabetes mellitus without complication   Hypothyroidism   Traumatic hematoma of head   Spinal stenosis   Hypoxia   History of splenectomy   Acute blood loss anemia   At high risk for injury related to fall    Time spent: 35 minutes    [facility] Pager [phone]. If 7PM-7AM, please contact night-coverage at [email or web address], password [facility] 02/11/2014, 2:57 PM  LOS: 5 days

## 2014-02-12 LAB — CBC
HCT: 32.1 % — ABNORMAL LOW (ref 36.0–46.0)
Hemoglobin: 10.9 g/dL — ABNORMAL LOW (ref 12.0–15.0)
MCH: 29.9 pg (ref 26.0–34.0)
MCHC: 34 g/dL (ref 30.0–36.0)
MCV: 87.9 fL (ref 78.0–100.0)
Platelets: 404 10*3/uL — ABNORMAL HIGH (ref 150–400)
RBC: 3.65 MIL/uL — AB (ref 3.87–5.11)
RDW: 14.9 % (ref 11.5–15.5)
WBC: 16.5 10*3/uL — AB (ref 4.0–10.5)

## 2014-02-12 LAB — BASIC METABOLIC PANEL
Anion gap: 11 (ref 5–15)
BUN: 18 mg/dL (ref 6–23)
CHLORIDE: 100 meq/L (ref 96–112)
CO2: 29 meq/L (ref 19–32)
Calcium: 9.3 mg/dL (ref 8.4–10.5)
Creatinine, Ser: 0.58 mg/dL (ref 0.50–1.10)
GFR calc Af Amer: >90 mL/min (ref >90)
GFR calc non Af Amer: 86 mL/min — ABNORMAL LOW (ref >90)
Glucose, Bld: 162 mg/dL — ABNORMAL HIGH (ref 70–99)
Potassium: 4.1 mEq/L (ref 3.7–5.3)
SODIUM: 140 meq/L (ref 137–147)

## 2014-02-12 LAB — GLUCOSE, CAPILLARY
GLUCOSE-CAPILLARY: 155 mg/dL — AB (ref 70–99)
Glucose-Capillary: 174 mg/dL — ABNORMAL HIGH (ref 70–99)

## 2014-02-12 MED ORDER — PREDNISONE 10 MG PO TABS: ORAL_TABLET | ORAL | Status: DC

## 2014-02-12 MED ORDER — GABAPENTIN 100 MG PO CAPS: 200.0000 mg | ORAL_CAPSULE | Freq: Every day | ORAL | Status: AC

## 2014-02-12 MED ORDER — DSS 100 MG PO CAPS: 100.0000 mg | ORAL_CAPSULE | Freq: Two times a day (BID) | ORAL | Status: AC

## 2014-02-12 MED ORDER — PREDNISONE 10 MG PO TABS: ORAL_TABLET | ORAL | Status: AC

## 2014-02-12 MED ORDER — DIAZEPAM 5 MG PO TABS: 5.0000 mg | ORAL_TABLET | Freq: Every day | ORAL | Status: AC | PRN

## 2014-02-12 MED ORDER — ENOXAPARIN SODIUM 30 MG/0.3ML ~~LOC~~ SOLN: 30.0000 mg | SUBCUTANEOUS | Status: AC

## 2014-02-12 MED ORDER — HYDROCODONE-ACETAMINOPHEN 5-325 MG PO TABS: 1.0000 | ORAL_TABLET | Freq: Four times a day (QID) | ORAL | Status: AC | PRN

## 2014-02-12 MED ORDER — BISACODYL 5 MG PO TBEC: 5.0000 mg | DELAYED_RELEASE_TABLET | Freq: Every day | ORAL | Status: AC | PRN

## 2014-02-12 NOTE — Discharge Summary (Signed)
The patient was seen and examined. She was discussed with NP, Ms. Renard Hamper. Agree with her assessment and plan as we have discussed. The patient has improved clinically.

## 2014-02-12 NOTE — Discharge Instructions (Signed)
Remove staples 02/20/14 Weight bearing as tolerated Follow up with Dr Aline Brochure 10/16 with xrays Abduction pillow until 10/16.  lovenox for 24 more days

## 2014-02-12 NOTE — Discharge Planning (Signed)
Pt stated she was ready to be DC and pain was under control.  IV removed, pt given pain meds just before DC and dressing changed.  Pt will be trans via EMS and report was called to Kathleen Argue, RN at [facility]  In [place], [place]

## 2014-02-12 NOTE — Clinical Social Work Placement (Signed)
Clinical Social Work Department CLINICAL SOCIAL WORK PLACEMENT NOTE 02/12/2014  Patient:  [name]  Account Number:  [id number] Admit date:  02/06/2014  Clinical Social Worker:  Benay Pike, LCSW  Date/time:  02/07/2014 02:51 PM  Clinical Social Work is seeking post-discharge placement for this patient at the following level of care:   SKILLED NURSING   (*CSW will update this form in Epic as items are completed)   02/07/2014  Patient/family provided with [facility] Department of Clinical Social Work's list of facilities offering this level of care within the geographic area requested by the patient (or if unable, by the patient's family).  02/07/2014  Patient/family informed of their freedom to choose among providers that offer the needed level of care, that participate in Medicare, Medicaid or managed care program needed by the patient, have an available bed and are willing to accept the patient.  02/07/2014  Patient/family informed of [facility]' ownership interest in [name], as well as of the fact that they are under no obligation to receive care at this facility.  PASARR submitted to EDS on  PASARR number received on   FL2 transmitted to all facilities in geographic area requested by pt/family on  02/07/2014 FL2 transmitted to all facilities within larger geographic area on   Patient informed that his/her managed care company has contracts with or will negotiate with  certain facilities, including the following:     Patient/family informed of bed offers received:  02/11/2014 Patient chooses bed at Other Physician recommends and patient chooses bed at  Other  Patient to be transferred to Other on  02/12/2014 Patient to be transferred to facility by [name] Patient and family notified of transfer on 02/12/2014 Name of family member notified:  pt refuses- states she will call  The following physician request were entered in Epic:   Additional  Comments: No pasarr needed per [place] facilities. Chose [place].  Benay Pike, [place]

## 2014-02-12 NOTE — Clinical Social Work Note (Signed)
Pt d/c today to [place]. Pt and facility aware and agreeable. D/C summary faxed. CSW requested to call family and pt refuses, stating she will call herself. Pt requesting transport via [facility].  Benay Pike, [place]

## 2014-02-12 NOTE — Discharge Summary (Signed)
Physician Discharge Summary  Elizabeth [name] JQB:[id number] DOB: [birth year] DOA: 02/06/2014  PCP: No primary provider on file.  Admit date: 02/06/2014 Discharge date: 02/12/2014  Time spent: 40 minutes  Recommendations for Outpatient Follow-up:  1. Follow up with Dr Aline Brochure 03/07/14 with xrays.  2. Follow up with Dr Carloyn Manner as scheduled  Discharge Diagnoses:  Principal Problem:   Fracture of femoral neck, right Active Problems:   HTN (hypertension)   Type 2 diabetes mellitus without complication   Hypothyroidism   Traumatic hematoma of head   Spinal stenosis   Hypoxia   History of splenectomy   Acute blood loss anemia   At high risk for injury related to fall   Discharge Condition: stable  Diet recommendation: carb modified  Filed Weights   02/06/14 1357 02/06/14 1924 02/10/14 0600  Weight: 71.668 kg (158 lb) 73.2 kg (161 lb 6 oz) 73.2 kg (161 lb 6 oz)    History of present illness:  This is a 63 your lady who had an accidental fall on 9/16 approximately 7 hours prior to presentation to ED. She was at a clothing store and her legs became somewhat numb. She went to sit down somewhere and was holding onto garment stand with wheels on it. The garment stand rolled and she fell. She did hit her head but did not lose consciousness. Her right hip became painful. Evaluation in the emergency room showed that she had a right hip fracture and she was  admitted for further management. She had hypertension and diabetes. There was no history of ischemic heart disease or cerebrovascular disease. She had no lung disease.   Hospital Course:  1. Acute right femoral neck fracture/hip fracture secondary to fall.  Status post right hip arthroplasty on 9/18, by Dr. Aline Brochure. Pain managed well. Staples to be removed 9/30. Weight bearing as tolerated. Lovenox for 24 more days. Follow up appointment with Dr Aline Brochure 03/07/14 as above   History of spinal stenosis with chronic back and leg pain.  The patient's  pain has subsided, but she appears to have a low pain tolerance. Gabapentin was started at bedtime on 02/09/14 and increased it to 200 mg each bedtime. Dr. Aline Brochure also ordered steroid IV therapy. Transitioned to po 02/10/14 and will be discharged with po taper.   Perioperative bleeding with acute blood loss anemia.  Per Dr. Aline Brochure, the patient had an appreciable amount of bleeding from the right femur during the operation. She was transfused one unit of packed red blood cells perioperatively. Her hemoglobin stable at 10.9.   Large right-sided cephalohematoma, secondary to fall.  CT of her head revealed no intracranial hemorrhage or other acute intracranial abnormality. Neurologically, she remained intact during this hospitalization.   Transient preoperative hypoxia.  Her oxygen saturation 95% room air. Followup chest x-ray on 9/19 revealed no active disease. Apparently, in the OR, her oxygen saturation fell to the 60s. She was placed on oxygen which improved her oxygen saturations to 100%. Etiology unclear, but it appears to have been transient. It could have been a technical error with the oximetry machine. Incentive spirometry ordered.   Leukocytosis.  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 She remained afebrile and had no sign of UTI or pneumonia on admission. Followup chest x-ray revealed no active disease. Followup urinalysis revealed no WBCs. Hematology was consulted to give recommendations on DVT prophylaxis in a patient with a history of  ITP and splenectomy. Dr. Bishop Limbo via PA Mr. Sheldon Silvan recommended typical DVT prophylaxis.   Hypertension.  Her blood pressure was controlled on ARB and amlodipine. Amlodipine held for 24 hours but resumed.    Diabetes mellitus, type II.  CBGs range 136-155. Her hemoglobin A1c was 6.9. Resume home metformin at discharge  Hypothyroidism.  We'll continue  Synthroid. TSH is modestly low at 0.225. Free T4 1.38.   Depression.  stable at baseline. Zoloft was withheld on admission, but it was restarted.    Procedures: Right hip arthroplasty 02/08/14  Consultations: Dr Aline Brochure orthopedics Discharge Exam: Filed Vitals:   02/12/14 1024  BP: 172/74  Pulse:   Temp:   Resp:     General: well nourished appears comfortable Cardiovascular:S1 and S2 +murmur. In hear no gallup or rub. No LE edema Respiratory: normal effort BS clear bilaterally to ausculation no wheeze MS: dressing right hip dry and  Intact. Right foot warm with PPP  Discharge Instructions You were cared for by a hospitalist during your hospital stay. If you have any questions about your discharge medications or the care you received while you were in the hospital after you are discharged, you can call the unit and asked to speak with the hospitalist on call if the hospitalist that took care of you is not available. Once you are discharged, your primary care physician will handle any further medical issues. Please note that NO REFILLS for any discharge medications will be authorized once you are discharged, as it is imperative that you return to your primary care physician (or establish a relationship with a primary care physician if you do not have one) for your aftercare needs so that they can reassess your need for medications and monitor your lab values.   Current Discharge Medication List    START taking these medications   Details  bisacodyl (DULCOLAX) 5 MG EC tablet Take 1 tablet (5 mg total) by mouth daily as needed for moderate constipation. Qty: 30 tablet, Refills: 0    docusate sodium 100 MG CAPS Take 100 mg by mouth 2 (two) times daily. Qty: 10 capsule, Refills: 0    enoxaparin (LOVENOX) 30 MG/0.3ML injection Inject 0.3 mLs (30 mg total) into the skin daily. Qty: 24 Syringe, Refills: 0    gabapentin (NEURONTIN) 100 MG capsule Take 2 capsules (200 mg total) by  mouth at bedtime. Qty: 30 capsule, Refills: 0    predniSONE (DELTASONE) 10 MG tablet Take 5 tabs for 1 day then; take 4 tabs for 1 day; then take 3 tabs for 1 day; then take 2 tabs for 1 day; then take 1 tab for 1 day then stop. Qty: 15 tablet, Refills: 0      CONTINUE these medications which have CHANGED   Details  HYDROcodone-acetaminophen (NORCO/VICODIN) 5-325 MG per tablet Take 1 tablet by mouth 4 (four) times daily as needed for moderate pain. Qty: 30 tablet, Refills: 0      CONTINUE these medications which have NOT CHANGED   Details  amLODipine (NORVASC) 5 MG tablet Take 1 tablet by mouth daily.    aspirin EC 81 MG tablet Take 81 mg by mouth at bedtime.    atorvastatin (LIPITOR) 20 MG tablet Take 1 tablet by mouth daily.    baclofen (LIORESAL) 10 MG tablet Take 1 tablet by mouth daily as needed for muscle spasms.     BENICAR 20 MG tablet Take 1 tablet by mouth daily.    diazepam (VALIUM) 5 MG tablet Take 1  tablet by mouth daily as needed for muscle spasms.     ibandronate (BONIVA) 150 MG tablet Take 1 tablet by mouth every 30 (thirty) days. First of the month.    levothyroxine (SYNTHROID, LEVOTHROID) 125 MCG tablet Take 1 tablet by mouth daily.    metFORMIN (GLUCOPHAGE-XR) 500 MG 24 hr tablet Take 2 tablets by mouth 2 (two) times daily.    naproxen (NAPROSYN) 500 MG tablet Take 1 tablet by mouth 2 (two) times daily.    pantoprazole (PROTONIX) 40 MG tablet Take 1 tablet by mouth daily.    sertraline (ZOLOFT) 100 MG tablet Take 1 tablet by mouth daily.       Allergies  Allergen Reactions  . Ivp Dye [Iodinated Diagnostic Agents] Hives  . Penicillins Hives  . Sulfa Antibiotics Nausea And Vomiting   Follow-up Information   Follow up with Arther Abbott, MD On 03/07/2014. (post op visit with xrays. appointment at 3:30. please arrive at 3pm)    Specialties:  Orthopedic Surgery, Radiology   Contact information:   [address] [place]  [address] [phone]        The results of significant diagnostics from this hospitalization (including imaging, microbiology, ancillary and laboratory) are listed below for reference.    Significant Diagnostic Studies: Dg Chest 1 View  02/06/2014   CLINICAL DATA:  Fall, pain  EXAM: CHEST - 1 VIEW  COMPARISON:  None.  FINDINGS: The aorta is unfolded and ectatic. Moderate enlargement of the cardiac silhouette is noted. Prominence of the central pulmonary arteries with rapid tapering may suggest pulmonary arterial hypertension. No pleural effusion. No acute osseous finding.  IMPRESSION: Cardiomegaly with possible pulmonary arterial hypertension but no focal acute finding allowing for one view technique.   Electronically Signed   By: Conchita Paris M.D.   On: 02/06/2014 15:20   Dg Hip Complete Right  02/06/2014   CLINICAL DATA:  Fall  EXAM: RIGHT HIP - COMPLETE 2+ VIEW  COMPARISON:  None.  FINDINGS: There is a displaced fracture through the right femoral neck. The femoral head appears appropriately located. Lower lumbar spine degenerative changes. SI joints unremarkable. Pelvic phleboliths.  IMPRESSION: Displaced right femoral neck fracture.   Electronically Signed   By: Lovey Newcomer M.D.   On: 02/06/2014 15:21   Ct Head Wo Contrast  02/06/2014   CLINICAL DATA:  Status post fall with hematoma over the right eyebrow  EXAM: CT HEAD WITHOUT CONTRAST  CT CERVICAL SPINE WITHOUT CONTRAST  TECHNIQUE: Multidetector CT imaging of the head and cervical spine was performed following the standard protocol without intravenous contrast. Multiplanar CT image reconstructions of the cervical spine were also generated.  COMPARISON:  None.  FINDINGS: CT HEAD FINDINGS  The ventricles are normal in size and position. There is no intracranial hemorrhage nor intracranial mass effect. There is no acute ischemic change. The cerebellum and brainstem are normal. The globes and other orbital soft tissues are normal with the  exception of preseptal edema on the right. At bone window settings the observed paranasal sinuses and mastoid air cells are clear. There is a large cephalohematoma over the right forehead. There is no acute skull fracture.  CT CERVICAL SPINE FINDINGS  The cervical vertebral bodies are preserved in height. There is mild disc space narrowing at C3-4, C4-5, and C6-7. The prevertebral soft tissue spaces are normal. There is no perched facet nor facet or spinous process fracture. The odontoid is intact.  IMPRESSION: 1. There is a large right-sided cephalohematoma.  There is no acute intracranial hemorrhage nor other acute intracranial abnormality. There is no skull fracture. 2. There is no acute cervical spine fracture nor dislocation. There is degenerative disc change at multiple levels.   Electronically Signed   By: David  Martinique   On: 02/06/2014 15:43   Ct Cervical Spine Wo Contrast  02/06/2014   CLINICAL DATA:  Status post fall with hematoma over the right eyebrow  EXAM: CT HEAD WITHOUT CONTRAST  CT CERVICAL SPINE WITHOUT CONTRAST  TECHNIQUE: Multidetector CT imaging of the head and cervical spine was performed following the standard protocol without intravenous contrast. Multiplanar CT image reconstructions of the cervical spine were also generated.  COMPARISON:  None.  FINDINGS: CT HEAD FINDINGS  The ventricles are normal in size and position. There is no intracranial hemorrhage nor intracranial mass effect. There is no acute ischemic change. The cerebellum and brainstem are normal. The globes and other orbital soft tissues are normal with the exception of preseptal edema on the right. At bone window settings the observed paranasal sinuses and mastoid air cells are clear. There is a large cephalohematoma over the right forehead. There is no acute skull fracture.  CT CERVICAL SPINE FINDINGS  The cervical vertebral bodies are preserved in height. There is mild disc space narrowing at C3-4, C4-5, and C6-7. The  prevertebral soft tissue spaces are normal. There is no perched facet nor facet or spinous process fracture. The odontoid is intact.  IMPRESSION: 1. There is a large right-sided cephalohematoma. There is no acute intracranial hemorrhage nor other acute intracranial abnormality. There is no skull fracture. 2. There is no acute cervical spine fracture nor dislocation. There is degenerative disc change at multiple levels.   Electronically Signed   By: David  Martinique   On: 02/06/2014 15:43   Dg Pelvis Portable  02/08/2014   CLINICAL DATA:  Status post hip replacement  EXAM: PORTABLE PELVIS 1-2 VIEWS  COMPARISON:  None.  FINDINGS: Right hip arthroplasty is noted.  No acute abnormality is seen.   Electronically Signed   By: Inez Catalina M.D.   On: 02/08/2014 13:35   Dg Chest Port 1 View  02/09/2014   CLINICAL DATA:  Leukocytosis.  EXAM: PORTABLE CHEST - 1 VIEW  COMPARISON:  02/06/2014  FINDINGS: Cardiac silhouette remains mildly enlarged, unchanged. Mild prominence of the central pulmonary arteries is again noted. Lungs are clear. No pleural effusion or pneumothorax is identified.  IMPRESSION: No active disease.   Electronically Signed   By: Logan Bores   On: 02/09/2014 10:19    Microbiology: Recent Results (from the past 240 hour(s))  MRSA PCR SCREENING     Status: None   Collection Time    02/07/14  2:15 PM      Result Value Ref Range Status   MRSA by PCR NEGATIVE  NEGATIVE Final   Comment:            The GeneXpert MRSA Assay (FDA     approved for NASAL specimens     only), is one component of a     comprehensive MRSA colonization     surveillance program. It is not     intended to diagnose MRSA     infection nor to guide or     monitor treatment for     MRSA infections.     Labs: Basic Metabolic Panel:  Recent Labs Lab 02/06/14 1411 02/07/14 0513 02/09/14 0444 02/10/14 0533 02/12/14 0605  NA 141 141 136* 140 140  K 3.9 3.9 4.1 4.1 4.1  CL 100 102 100 102 100  CO2 24 27 26 27 29    GLUCOSE 136* 160* 193* 200* 162*  BUN 15 10 10 11 18   CREATININE 0.69 0.64 0.59 0.52 0.58  CALCIUM 9.9 9.2 8.2* 8.6 9.3   Liver Function Tests:  Recent Labs Lab 02/06/14 1411 02/07/14 0513  AST 35 26  ALT 28 24  ALKPHOS 56 52  BILITOT 0.4 0.5  PROT 7.7 7.0  ALBUMIN 4.4 3.8   No results found for this basename: LIPASE, AMYLASE,  in the last 168 hours No results found for this basename: AMMONIA,  in the last 168 hours CBC:  Recent Labs Lab 02/06/14 1411  02/08/14 1335 02/09/14 0444 02/10/14 0533 02/11/14 0608 02/12/14 0605  WBC 14.1*  < > 19.5* 19.4* 17.6* 19.1* 16.5*  NEUTROABS 8.5*  --   --   --   --   --   --   HGB 14.1  < > 12.0 11.3* 10.5* 10.3* 10.9*  HCT 41.2  < > 35.3* 33.8* 30.6* 30.0* 32.1*  MCV 87.5  < > 89.6 88.9 88.2 88.2 87.9  PLT 274  < > 243 243 280 338 404*  < > = values in this interval not displayed. Cardiac Enzymes: No results found for this basename: CKTOTAL, CKMB, CKMBINDEX, TROPONINI,  in the last 168 hours BNP: BNP (last 3 results) No results found for this basename: PROBNP,  in the last 8760 hours CBG:  Recent Labs Lab 02/11/14 1005 02/11/14 1204 02/11/14 1654 02/11/14 2120 02/12/14 0756  GLUCAP 130* 139* 175* 136* 155*       Signed:  Rexanne Inocencio M  [facility] 02/12/2014, 12:00 PM

## 2014-02-14 NOTE — Progress Notes (Signed)
UR chart review completed.  

## 2014-02-22 LAB — CA 125

## 2014-03-07 ENCOUNTER — Ambulatory Visit (INDEPENDENT_AMBULATORY_CARE_PROVIDER_SITE_OTHER): Payer: Self-pay | Admitting: Orthopedic Surgery

## 2014-03-07 ENCOUNTER — Ambulatory Visit: Payer: Self-pay | Admitting: Orthopedic Surgery

## 2014-03-07 ENCOUNTER — Encounter: Payer: Self-pay | Admitting: Orthopedic Surgery

## 2014-03-07 VITALS — BP 133/75 | Ht 62.0 in | Wt 161.0 lb

## 2014-03-07 DIAGNOSIS — S72001D Fracture of unspecified part of neck of right femur, subsequent encounter for closed fracture with routine healing: Secondary | ICD-10-CM

## 2014-03-07 NOTE — Progress Notes (Signed)
Chief Complaint  Patient presents with  . Follow-up    POST OP Right hip bipolar, DOS 02/08/14    BP 133/75  Ht 5\' 2"  (1.575 m)  Wt 161 lb (73.029 kg)  BMI 29.44 kg/m2  The patient is a [place] for a rehabilitation center she is doing well she is weightbearing as tolerated with a walker she's ready to go home. She can get out of the bed alone she can get to the bathroom alone  Exam shows that her leg lengths are equal flexion is 110 she has some discomfort with that but just tightness  Is mild peripheral edema bilaterally right slightly worse than left she is in TED hose  Followup with me in 2 months [facility]

## 2014-04-09 ENCOUNTER — Encounter: Payer: Self-pay | Admitting: Oncology

## 2014-05-09 ENCOUNTER — Encounter: Payer: Self-pay | Admitting: Orthopedic Surgery

## 2014-05-09 ENCOUNTER — Ambulatory Visit (INDEPENDENT_AMBULATORY_CARE_PROVIDER_SITE_OTHER): Payer: Self-pay | Admitting: Orthopedic Surgery

## 2014-05-09 VITALS — BP 140/79 | Ht 62.0 in | Wt 161.0 lb

## 2014-05-09 DIAGNOSIS — S72001D Fracture of unspecified part of neck of right femur, subsequent encounter for closed fracture with routine healing: Secondary | ICD-10-CM

## 2014-05-09 NOTE — Progress Notes (Signed)
Patient ID: Elizabeth [name], female   DOB: 06-09-1933, 80 y.o.   MRN: [id number] Chief Complaint  Patient presents with  . Follow-up    3 month recheck right bipolar hip, DOS 02/08/14    The patient had an uncomplicated right bipolar hip replacement. However, she had an appointment to see a spine specialist the day before her fall because of a ongoing spinal stenosis condition. She has not seen a spine specialist since that injury to her hip. No complaints with the hip. She has excellent motion leg lengths are equal she is requiring a walker because her legs will go numb and collapse  She is referred to Dr. Carloyn Manner again as well as the spine specialist in [place]'s because she would like to be evaluated for laser surgery is set up open surgery  She is released from care here.

## 2014-09-19 ENCOUNTER — Telehealth: Payer: Self-pay | Admitting: Orthopedic Surgery

## 2014-09-19 NOTE — Telephone Encounter (Signed)
Patient is calling asking if she needs an antibiotic to go to the dentist since her partial hip replacement in Sept 2015, please advise?

## 2014-09-20 ENCOUNTER — Other Ambulatory Visit: Payer: Self-pay | Admitting: *Deleted

## 2014-09-20 MED ORDER — CLINDAMYCIN HCL 300 MG PO CAPS: ORAL_CAPSULE | ORAL | Status: AC

## 2014-09-20 NOTE — Telephone Encounter (Signed)
Yes, med sent to pharmacy, patient aware

## 2015-01-14 ENCOUNTER — Ambulatory Visit (INDEPENDENT_AMBULATORY_CARE_PROVIDER_SITE_OTHER): Payer: Medicare Other | Admitting: Orthopedic Surgery

## 2015-01-14 ENCOUNTER — Encounter: Payer: Self-pay | Admitting: Orthopedic Surgery

## 2015-01-14 VITALS — BP 145/89 | Ht 62.0 in | Wt 149.0 lb

## 2015-01-14 DIAGNOSIS — M4806 Spinal stenosis, lumbar region: Secondary | ICD-10-CM | POA: Diagnosis not present

## 2015-01-14 DIAGNOSIS — M48061 Spinal stenosis, lumbar region without neurogenic claudication: Secondary | ICD-10-CM

## 2015-01-14 NOTE — Patient Instructions (Signed)
Follow up with your spine doctor

## 2015-01-14 NOTE — Progress Notes (Signed)
Established patient new problem  Chief complaint left hip pain  The patient had a right hip fracture treated with a bipolar replacement with within the last 3 years. She also had spinal stenosis surgery for right leg radicular symptoms presents with left gluteal pain radiating down to her left ankle. The pain is constant she gets relief by lying flat in bed. She is currently on hydrocodone which partially relieves her pain. Her spine surgery is less than a year  Pertinent review of systems she has global joint pain anxiety excessive and frequent nighttime urination burning pain in her legs weakness night sweats and fatigue  Past Medical History  Diagnosis Date  . Arthritis   . Anxiety   . Hypothyroidism   . Hypertension   . Diabetes mellitus without complication   . Cancer     ovarian  . Fracture of femoral neck, right 02/06/2014  . Traumatic hematoma of head 02/06/2014  . Spinal stenosis   . History of splenectomy 2002    For treatment of ITP    Past Surgical History  Procedure Laterality Date  . Splenectomy, total    . Abdominal hysterectomy    . Appendectomy    . Hand surgery    . Foot surgery    . Back surgery    . Hip arthroplasty Right 02/08/2014    Procedure: ARTHROPLASTY BIPOLAR HIP;  Surgeon: Carole Civil, MD;  Location: [facility];  Service: Orthopedics;  Laterality: Right;    BP 145/89 mmHg  Ht 5\' 2"  (1.575 m)  Wt 149 lb (67.586 kg)  BMI 27.25 kg/m2 She is well-developed well-nourished grooming and hygiene normal. She is oriented 3. Mood normal. Her gait is unremarkable.  She has tenderness in her lower back and left gluteal area with normal range of motion without pain in her left hip leg lengths are equal hip is stable motor exam is normal she has good distal pulses without edema sensory exam reveals pressure and position sense normal skin normal lymph nodes negative  X-ray from a server a disc show that she has a normal hip joint  Recommend she see her  spine surgeon for further workup for spinal stenosis with radicular pain on the left leg at the hip joint is normal

## 2015-02-25 ENCOUNTER — Ambulatory Visit: Payer: Medicare Other | Admitting: Orthopedic Surgery

## 2015-11-08 IMAGING — CR DG HIP COMPLETE 2+V*R*
3 series · 3 of 3 positions shown · non-contrast
Comparison: None.

CLINICAL DATA: Fall

EXAM:
RIGHT HIP - COMPLETE 2+ VIEW

[view not recorded (1 of 3)]
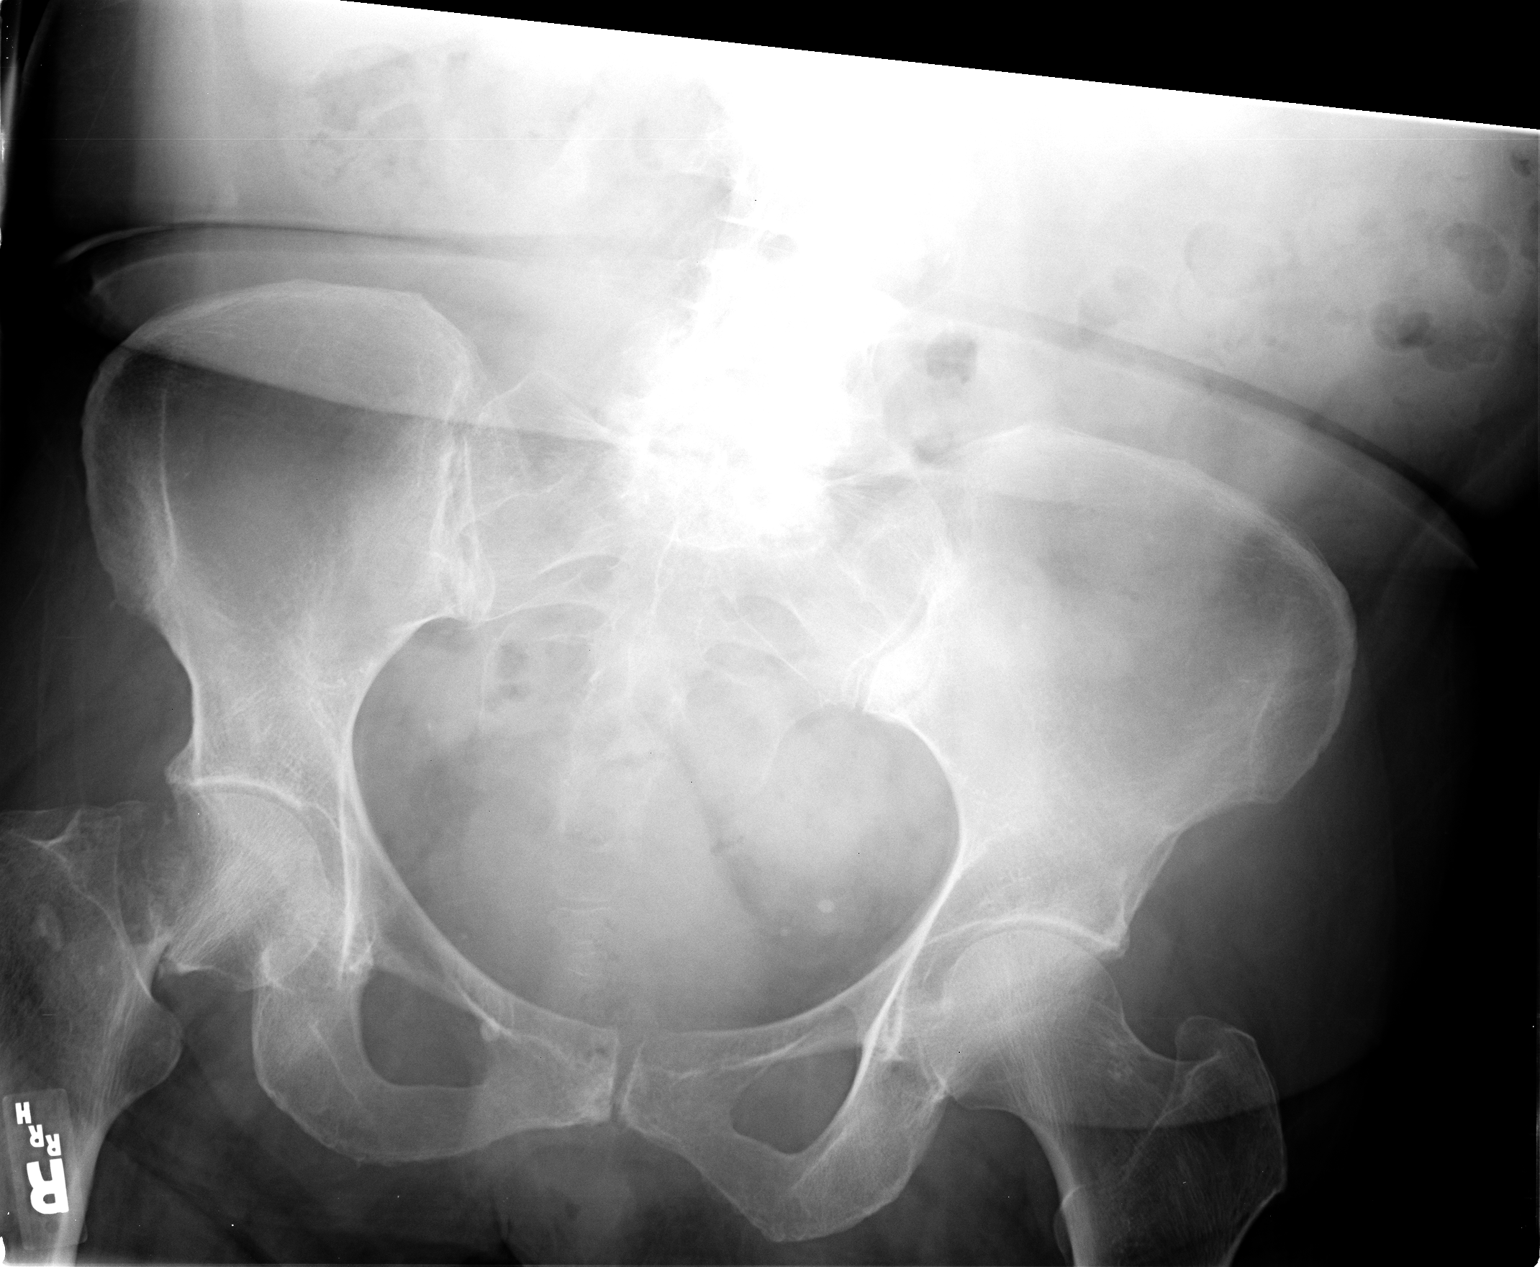

[view not recorded (2 of 3)]
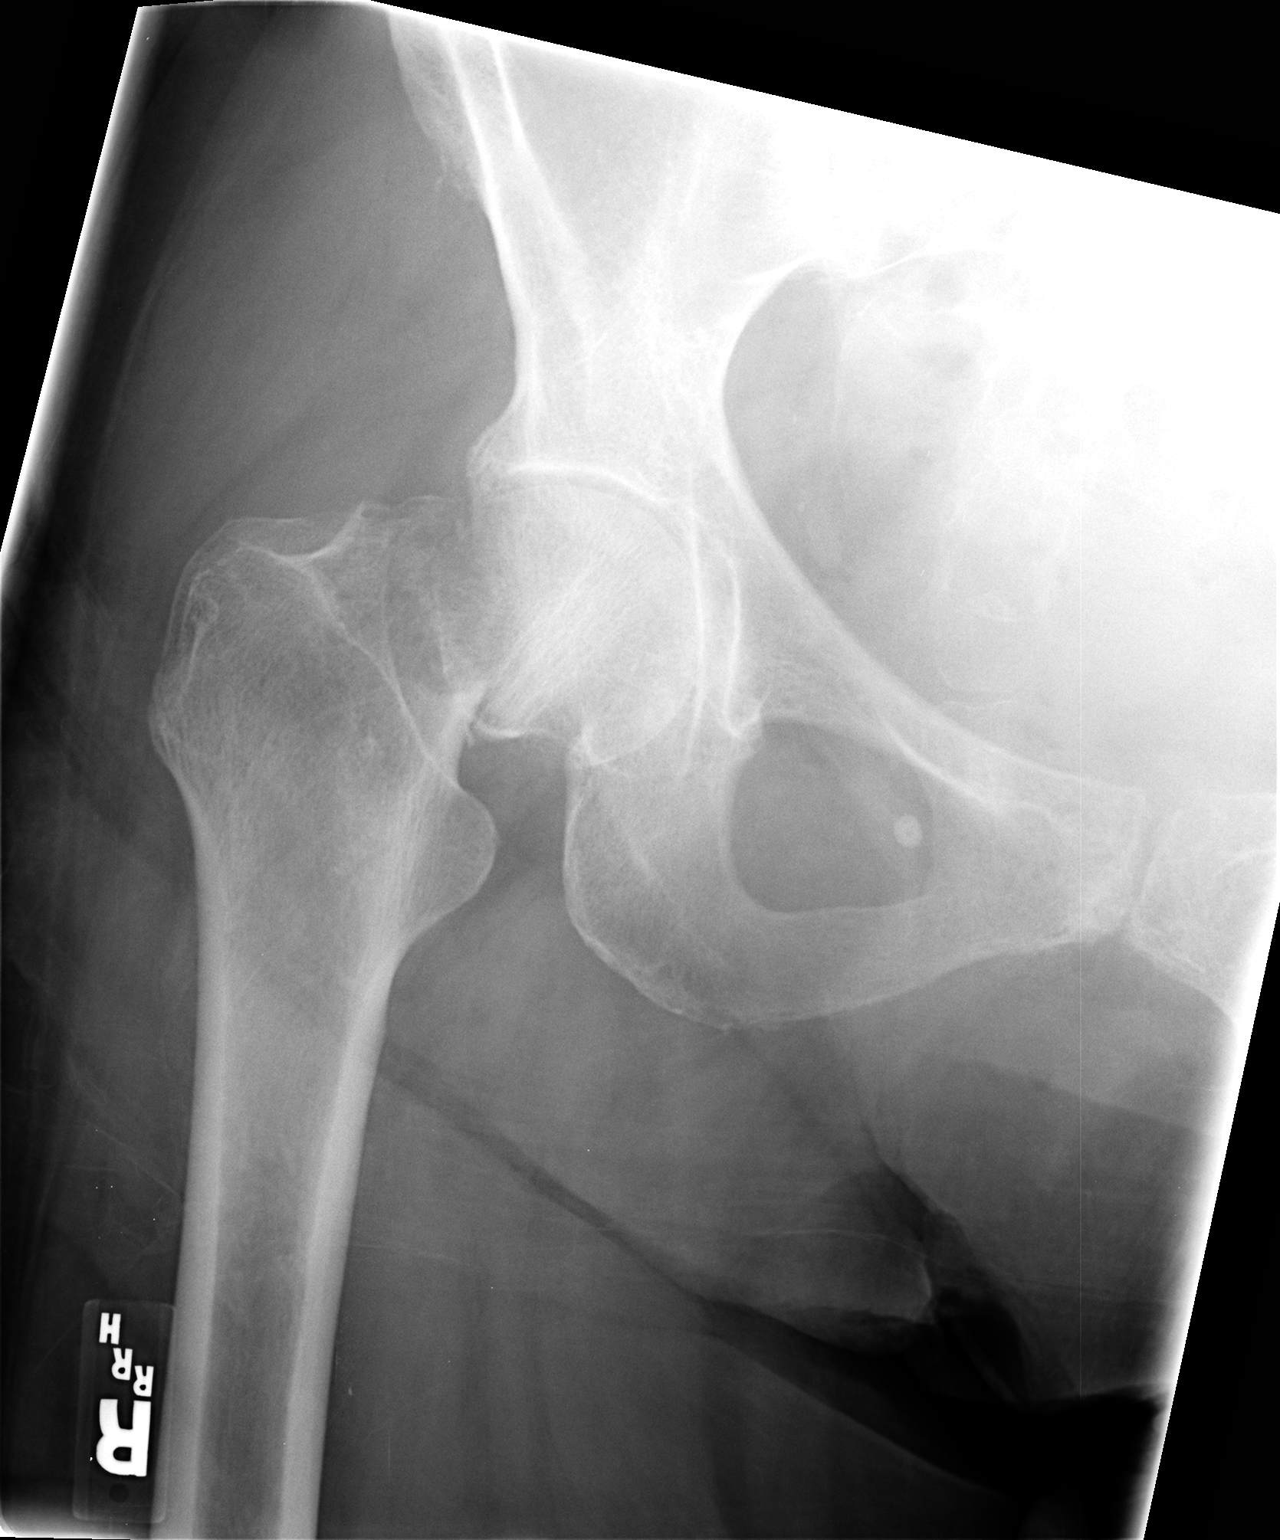

[view not recorded (3 of 3)]
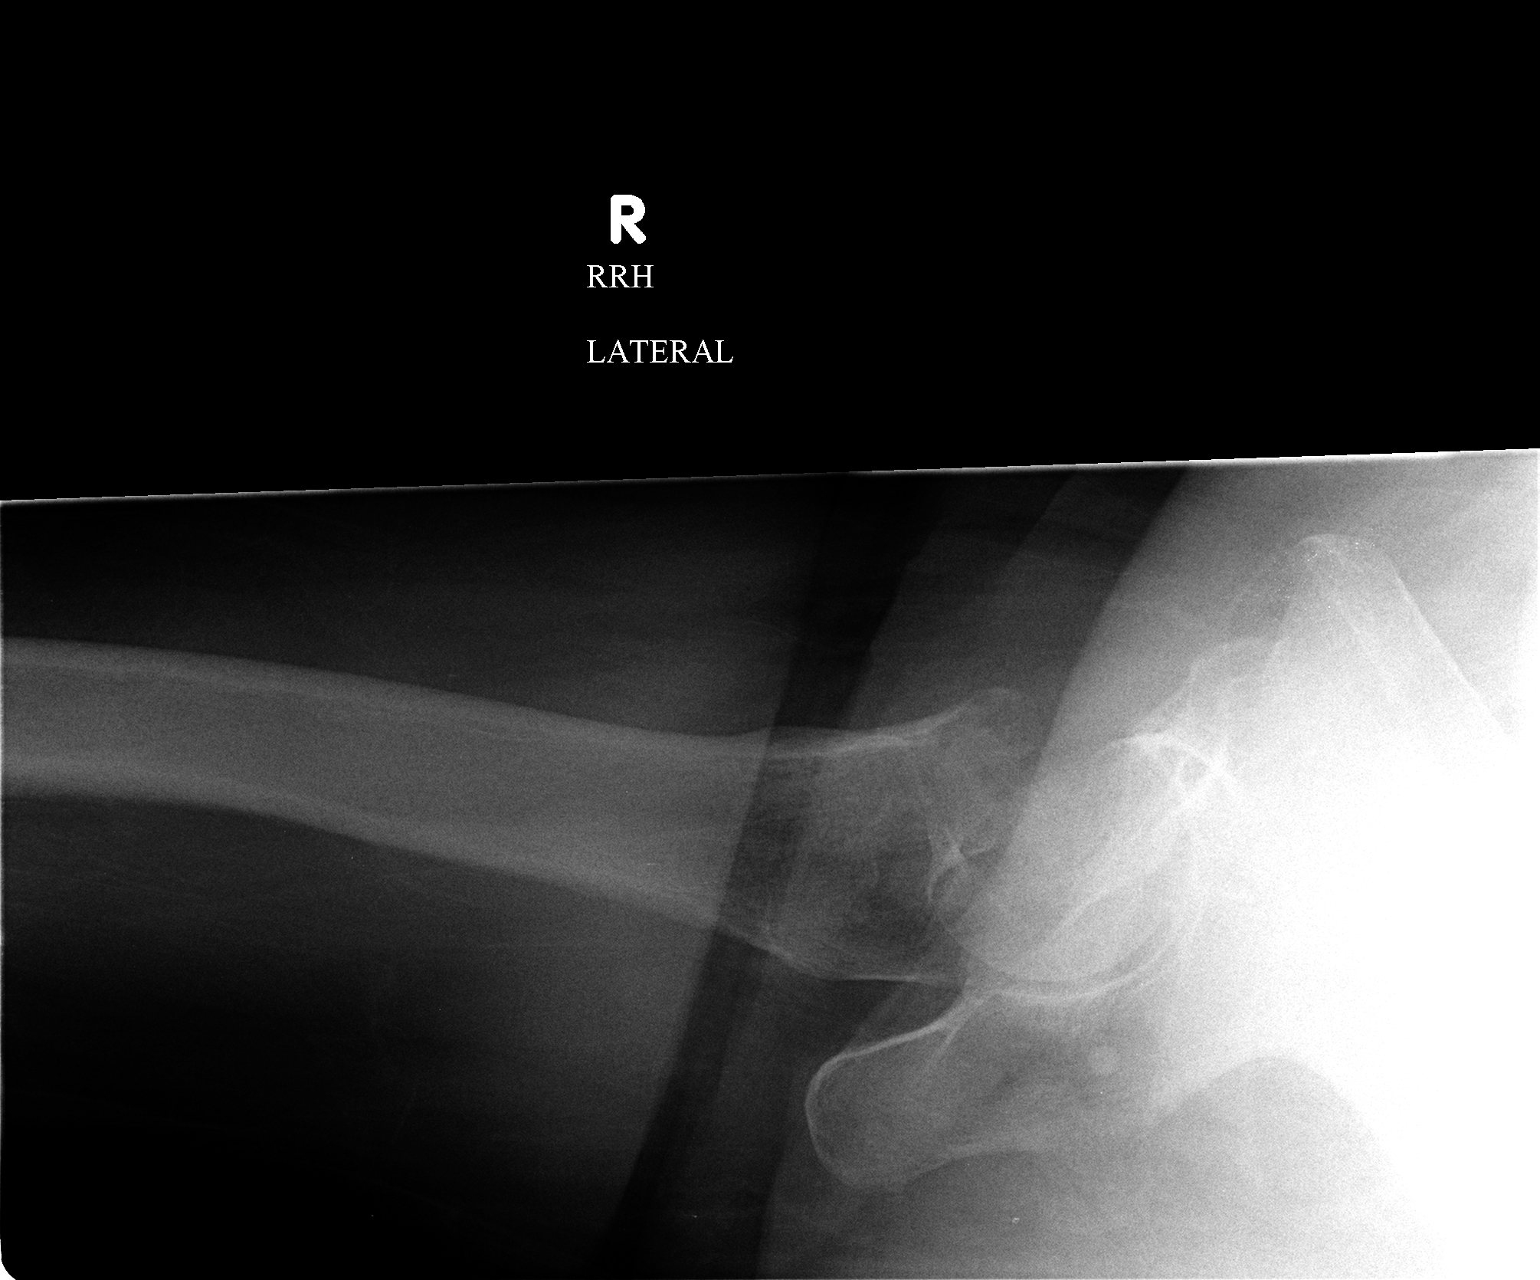

[3 of 3 positions shown; findings below may reference images not displayed]

FINDINGS: There is a displaced fracture through the right femoral neck. The
femoral head appears appropriately located. Lower lumbar spine
degenerative changes. SI joints unremarkable. Pelvic phleboliths.
IMPRESSION: Displaced right femoral neck fracture.

## 2015-11-08 IMAGING — CR DG CHEST 1V
1 series · 1 of 1 positions shown · non-contrast
Comparison: None.

CLINICAL DATA: Fall, pain

EXAM:
CHEST - 1 VIEW

[view not recorded]
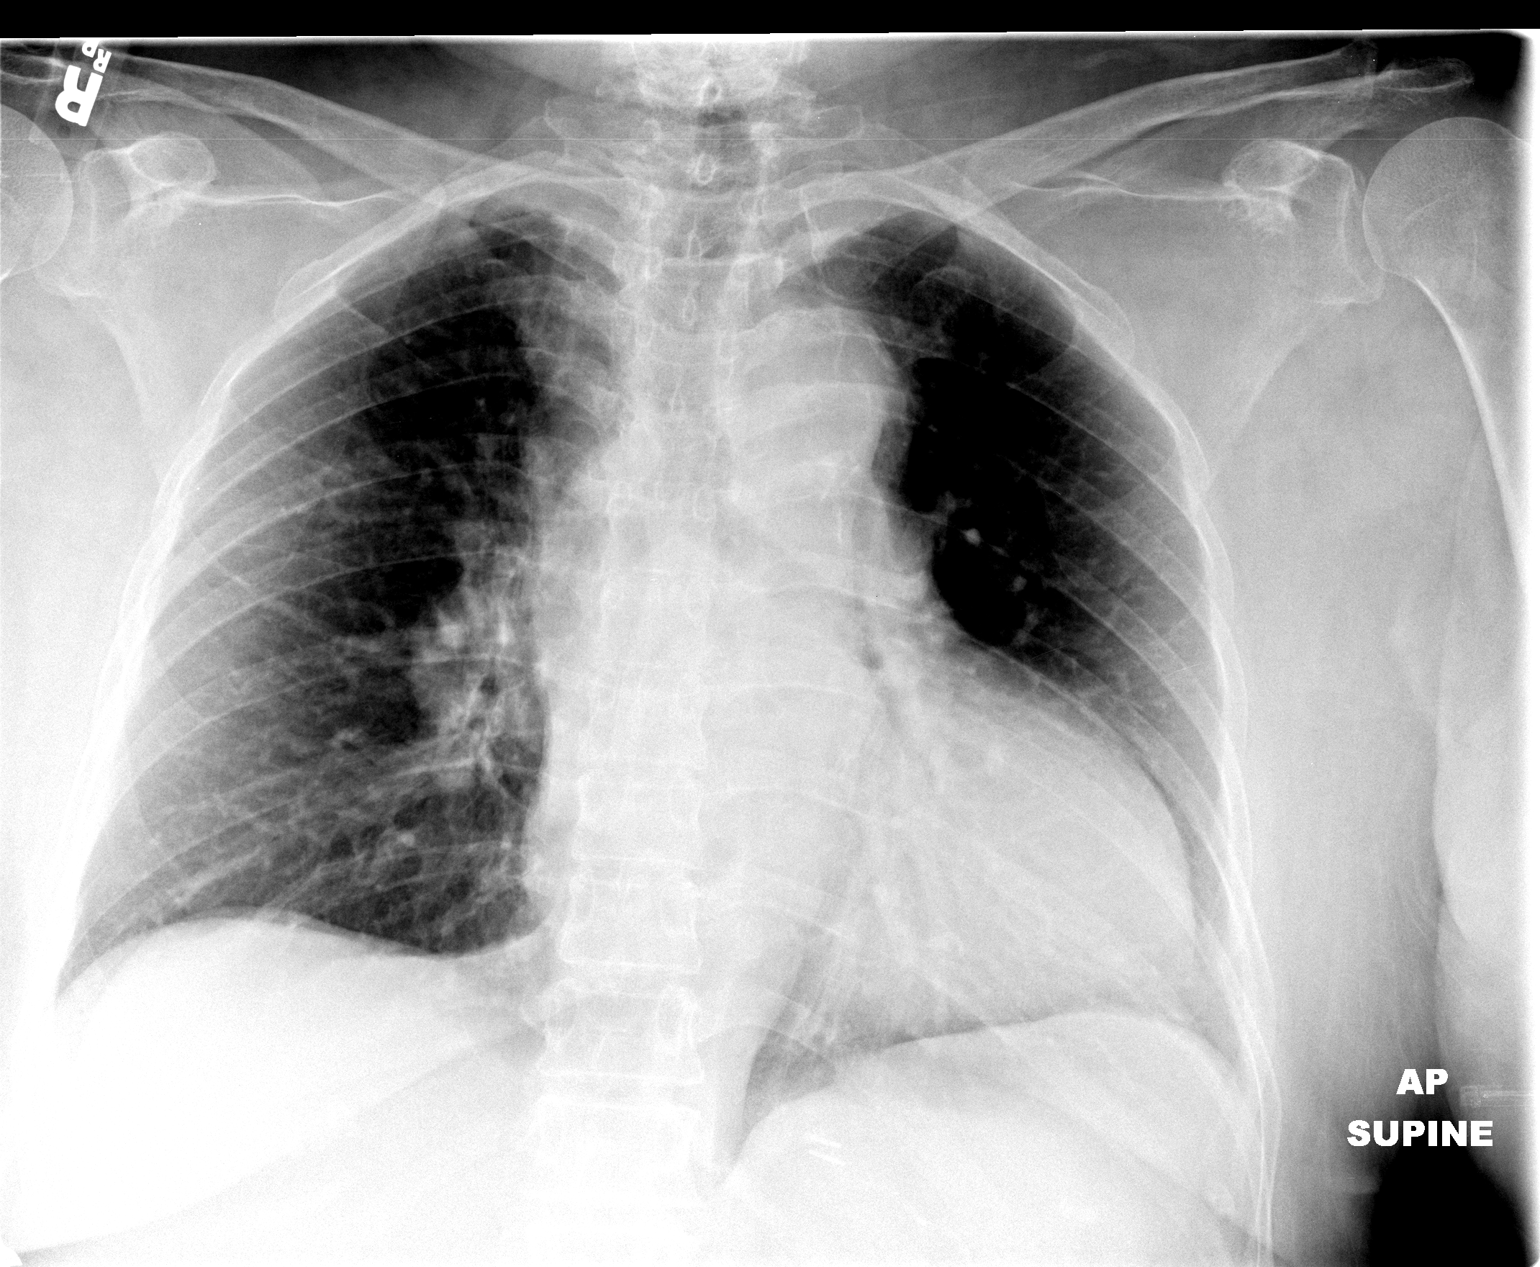

[1 of 1 positions shown; findings below may reference images not displayed]

FINDINGS: The aorta is unfolded and ectatic. Moderate enlargement of the
cardiac silhouette is noted. Prominence of the central pulmonary
arteries with rapid tapering may suggest pulmonary arterial
hypertension. No pleural effusion. No acute osseous finding.
IMPRESSION: Cardiomegaly with possible pulmonary arterial hypertension but no
focal acute finding allowing for one view technique.
